# Patient Record
Sex: Female | Born: 1980 | Race: Black or African American | Hispanic: No | Marital: Married | Smoking: Never smoker
Health system: Southern US, Community
[De-identification: ages and names within clinical notes are randomized; demographics above are authoritative.]

## PROBLEM LIST (undated history)

## (undated) DIAGNOSIS — T7840XA Allergy, unspecified, initial encounter: Secondary | ICD-10-CM

## (undated) DIAGNOSIS — R87629 Unspecified abnormal cytological findings in specimens from vagina: Secondary | ICD-10-CM

## (undated) DIAGNOSIS — E271 Primary adrenocortical insufficiency: Secondary | ICD-10-CM

## (undated) DIAGNOSIS — K219 Gastro-esophageal reflux disease without esophagitis: Secondary | ICD-10-CM

## (undated) DIAGNOSIS — Z8619 Personal history of other infectious and parasitic diseases: Secondary | ICD-10-CM

## (undated) DIAGNOSIS — E039 Hypothyroidism, unspecified: Secondary | ICD-10-CM

## (undated) DIAGNOSIS — U071 COVID-19: Secondary | ICD-10-CM

## (undated) DIAGNOSIS — D649 Anemia, unspecified: Secondary | ICD-10-CM

## (undated) DIAGNOSIS — Z9079 Acquired absence of other genital organ(s): Secondary | ICD-10-CM

## (undated) HISTORY — DX: COVID-19: U07.1

## (undated) HISTORY — DX: Unspecified abnormal cytological findings in specimens from vagina: R87.629

## (undated) HISTORY — DX: Gastro-esophageal reflux disease without esophagitis: K21.9

## (undated) HISTORY — DX: Allergy, unspecified, initial encounter: T78.40XA

## (undated) HISTORY — DX: Personal history of other infectious and parasitic diseases: Z86.19

## (undated) HISTORY — PX: OTHER SURGICAL HISTORY: SHX169

---

## 2010-05-04 ENCOUNTER — Inpatient Hospital Stay (HOSPITAL_COMMUNITY): Admission: RE | Admit: 2010-05-04 | Discharge: 2010-05-07 | Payer: Self-pay | Admitting: Obstetrics and Gynecology

## 2010-09-13 LAB — BASIC METABOLIC PANEL
BUN: 4 mg/dL — ABNORMAL LOW (ref 6–23)
CO2: 25 mEq/L (ref 19–32)
Calcium: 8.4 mg/dL (ref 8.4–10.5)
Creatinine, Ser: 0.57 mg/dL (ref 0.4–1.2)
Glucose, Bld: 101 mg/dL — ABNORMAL HIGH (ref 70–99)

## 2010-09-13 LAB — SURGICAL PCR SCREEN: Staphylococcus aureus: NEGATIVE

## 2010-09-13 LAB — CBC
Hemoglobin: 10.3 g/dL — ABNORMAL LOW (ref 12.0–15.0)
Hemoglobin: 12.2 g/dL (ref 12.0–15.0)
MCH: 28.5 pg (ref 26.0–34.0)
MCH: 28.6 pg (ref 26.0–34.0)
MCHC: 33 g/dL (ref 30.0–36.0)
MCHC: 33.1 g/dL (ref 30.0–36.0)
Platelets: 124 10*3/uL — ABNORMAL LOW (ref 150–400)
Platelets: 127 10*3/uL — ABNORMAL LOW (ref 150–400)
RDW: 14.4 % (ref 11.5–15.5)
RDW: 14.6 % (ref 11.5–15.5)

## 2010-09-13 LAB — RPR: RPR Ser Ql: NONREACTIVE

## 2011-04-10 LAB — OB RESULTS CONSOLE HIV ANTIBODY (ROUTINE TESTING): HIV: NONREACTIVE

## 2011-04-10 LAB — OB RESULTS CONSOLE ANTIBODY SCREEN: Antibody Screen: NEGATIVE

## 2011-04-24 LAB — OB RESULTS CONSOLE GC/CHLAMYDIA: Chlamydia: NEGATIVE

## 2011-08-29 LAB — OB RESULTS CONSOLE RPR: RPR: NONREACTIVE

## 2011-08-29 LAB — OB RESULTS CONSOLE ABO/RH

## 2011-10-24 ENCOUNTER — Encounter (HOSPITAL_COMMUNITY): Payer: Self-pay | Admitting: Pharmacist

## 2011-11-01 ENCOUNTER — Other Ambulatory Visit: Payer: Self-pay | Admitting: Obstetrics and Gynecology

## 2011-11-03 ENCOUNTER — Encounter (HOSPITAL_COMMUNITY): Payer: Self-pay

## 2011-11-06 ENCOUNTER — Encounter (HOSPITAL_COMMUNITY): Payer: Self-pay

## 2011-11-06 ENCOUNTER — Encounter (HOSPITAL_COMMUNITY)
Admission: RE | Admit: 2011-11-06 | Discharge: 2011-11-06 | Disposition: A | Discharge status: Home / Self Care | Payer: BC Managed Care – PPO | Source: Ambulatory Visit | Attending: Obstetrics and Gynecology | Admitting: Obstetrics and Gynecology

## 2011-11-06 HISTORY — DX: Primary adrenocortical insufficiency: E27.1

## 2011-11-06 HISTORY — DX: Anemia, unspecified: D64.9

## 2011-11-06 HISTORY — DX: Hypothyroidism, unspecified: E03.9

## 2011-11-06 LAB — BASIC METABOLIC PANEL
BUN: 5 mg/dL — ABNORMAL LOW (ref 6–23)
CO2: 24 mEq/L (ref 19–32)
Calcium: 8.2 mg/dL — ABNORMAL LOW (ref 8.4–10.5)
Chloride: 104 mEq/L (ref 96–112)
Creatinine, Ser: 0.51 mg/dL (ref 0.50–1.10)

## 2011-11-06 LAB — CBC
HCT: 32.1 % — ABNORMAL LOW (ref 36.0–46.0)
MCHC: 29.9 g/dL — ABNORMAL LOW (ref 30.0–36.0)
MCV: 75.9 fL — ABNORMAL LOW (ref 78.0–100.0)
Platelets: 161 10*3/uL (ref 150–400)
RDW: 16.9 % — ABNORMAL HIGH (ref 11.5–15.5)
WBC: 6.3 10*3/uL (ref 4.0–10.5)

## 2011-11-06 LAB — SURGICAL PCR SCREEN: Staphylococcus aureus: NEGATIVE

## 2011-11-06 NOTE — Pre-Procedure Instructions (Signed)
I asked Dr. Rodman Pickle if pt should take her Cortef as usual the am of surgery- Dr. Rodman Pickle does want pt to take at home prior to arrival to hospital.

## 2011-11-06 NOTE — Patient Instructions (Addendum)
YOUR PROCEDURE IS SCHEDULED ON:11/13/11 ENTER THROUGH THE MAIN ENTRANCE OF [facility] AT:0745am  USE DESK PHONE AND DIAL [phone] TO INFORM us OF YOUR ARRIVAL  CALL [phone] IF YOU HAVE ANY QUESTIONS OR PROBLEMS PRIOR TO YOUR ARRIVAL.  REMEMBER: DO NOT EAT OR DRINK AFTER MIDNIGHT Sunday  YOU MAY BRUSH YOUR TEETH THE MORNING OF SURGERY   TAKE THESE MEDICINES THE DAY OF SURGERY WITH SIP OF WATER:thyroid med, Cortef   DO NOT WEAR JEWELRY, EYE MAKEUP, LIPSTICK OR DARK FINGERNAIL POLISH DO NOT WEAR LOTIONS DO NOT SHAVE FOR 48 HOURS PRIOR TO SURGERY  YOU WILL NOT BE ALLOWED TO DRIVE YOURSELF HOME.  NAME OF DRIVER:Marcus Maisie Fus

## 2011-11-07 NOTE — Pre-Procedure Instructions (Signed)
Dr. Rodman Pickle notified of pt's K+ result of 3.0. Pt has Addison's Disease and Dr. Rodman Pickle ok with low result.

## 2011-11-08 ENCOUNTER — Encounter (HOSPITAL_COMMUNITY)
Admission: AD | Disposition: A | Discharge status: Home / Self Care | Payer: Self-pay | Source: Ambulatory Visit | Attending: Obstetrics and Gynecology

## 2011-11-08 ENCOUNTER — Inpatient Hospital Stay (HOSPITAL_COMMUNITY)
Admission: AD | Admit: 2011-11-08 | Discharge: 2011-11-10 | DRG: 370 | Disposition: A | Discharge status: Home / Self Care | Payer: BC Managed Care – PPO | Source: Ambulatory Visit | Attending: Obstetrics and Gynecology | Admitting: Obstetrics and Gynecology

## 2011-11-08 ENCOUNTER — Encounter (HOSPITAL_COMMUNITY): Payer: Self-pay | Admitting: Anesthesiology

## 2011-11-08 ENCOUNTER — Inpatient Hospital Stay (HOSPITAL_COMMUNITY): Payer: BC Managed Care – PPO | Admitting: Anesthesiology

## 2011-11-08 ENCOUNTER — Encounter (HOSPITAL_COMMUNITY): Payer: Self-pay | Admitting: *Deleted

## 2011-11-08 ENCOUNTER — Encounter (HOSPITAL_COMMUNITY): Payer: Self-pay | Admitting: General Surgery

## 2011-11-08 DIAGNOSIS — Z01812 Encounter for preprocedural laboratory examination: Secondary | ICD-10-CM

## 2011-11-08 DIAGNOSIS — O34219 Maternal care for unspecified type scar from previous cesarean delivery: Principal | ICD-10-CM | POA: Diagnosis present

## 2011-11-08 DIAGNOSIS — E039 Hypothyroidism, unspecified: Secondary | ICD-10-CM | POA: Diagnosis present

## 2011-11-08 DIAGNOSIS — D62 Acute posthemorrhagic anemia: Secondary | ICD-10-CM | POA: Diagnosis not present

## 2011-11-08 DIAGNOSIS — O9903 Anemia complicating the puerperium: Secondary | ICD-10-CM | POA: Diagnosis not present

## 2011-11-08 DIAGNOSIS — Z01818 Encounter for other preprocedural examination: Secondary | ICD-10-CM

## 2011-11-08 DIAGNOSIS — E2749 Other adrenocortical insufficiency: Secondary | ICD-10-CM | POA: Diagnosis present

## 2011-11-08 DIAGNOSIS — E079 Disorder of thyroid, unspecified: Secondary | ICD-10-CM | POA: Diagnosis present

## 2011-11-08 LAB — CBC
MCHC: 30.4 g/dL (ref 30.0–36.0)
RDW: 16.9 % — ABNORMAL HIGH (ref 11.5–15.5)
WBC: 8.2 10*3/uL (ref 4.0–10.5)

## 2011-11-08 LAB — RPR: RPR Ser Ql: NONREACTIVE

## 2011-11-08 SURGERY — Surgical Case
Anesthesia: Spinal | Site: Abdomen | Wound class: Clean Contaminated

## 2011-11-08 MED ORDER — NALBUPHINE HCL 10 MG/ML IJ SOLN: 5.0000 mg | INTRAMUSCULAR | Status: DC | PRN

## 2011-11-08 MED ORDER — KETOROLAC TROMETHAMINE 60 MG/2ML IM SOLN
60.0000 mg | Freq: Once | INTRAMUSCULAR | Status: AC | PRN
  Administered 2011-11-08: 60 mg via INTRAMUSCULAR

## 2011-11-08 MED ORDER — HYDROCORTISONE SOD SUCCINATE 100 MG IJ SOLR
100.0000 mg | Freq: Once | INTRAMUSCULAR | Status: AC
  Administered 2011-11-08: 100 mg via INTRAVENOUS
  Filled 2011-11-08: qty 2

## 2011-11-08 MED ORDER — FLEET ENEMA 7-19 GM/118ML RE ENEM: 1.0000 | ENEMA | Freq: Every day | RECTAL | Status: DC | PRN

## 2011-11-08 MED ORDER — IBUPROFEN 600 MG PO TABS
600.0000 mg | ORAL_TABLET | Freq: Four times a day (QID) | ORAL | Status: DC
  Administered 2011-11-09 – 2011-11-10 (×6): 600 mg via ORAL
  Filled 2011-11-08 (×6): qty 1

## 2011-11-08 MED ORDER — HYDROCORTISONE SOD SUCCINATE 100 MG IJ SOLR
100.0000 mg | Freq: Three times a day (TID) | INTRAMUSCULAR | Status: DC
Start: 2011-11-08 — End: 2011-11-09
  Administered 2011-11-08 – 2011-11-09 (×2): 100 mg via INTRAVENOUS
  Filled 2011-11-08 (×6): qty 2

## 2011-11-08 MED ORDER — NALOXONE HCL 0.4 MG/ML IJ SOLN: 0.4000 mg | INTRAMUSCULAR | Status: DC | PRN

## 2011-11-08 MED ORDER — HYDROMORPHONE HCL PF 1 MG/ML IJ SOLN: 0.2500 mg | INTRAMUSCULAR | Status: DC | PRN

## 2011-11-08 MED ORDER — LANOLIN HYDROUS EX OINT: 1.0000 "application " | TOPICAL_OINTMENT | CUTANEOUS | Status: DC | PRN

## 2011-11-08 MED ORDER — CEFAZOLIN SODIUM 1-5 GM-% IV SOLN
1.0000 g | Freq: Three times a day (TID) | INTRAVENOUS | Status: AC
  Administered 2011-11-08 – 2011-11-09 (×2): 1 g via INTRAVENOUS
  Filled 2011-11-08 (×2): qty 50

## 2011-11-08 MED ORDER — METOCLOPRAMIDE HCL 5 MG/ML IJ SOLN: 10.0000 mg | Freq: Three times a day (TID) | INTRAMUSCULAR | Status: DC | PRN

## 2011-11-08 MED ORDER — TETANUS-DIPHTH-ACELL PERTUSSIS 5-2.5-18.5 LF-MCG/0.5 IM SUSP: 0.5000 mL | Freq: Once | INTRAMUSCULAR | Status: DC

## 2011-11-08 MED ORDER — SODIUM CHLORIDE 0.9 % IV SOLN: 1.0000 ug/kg/h | INTRAVENOUS | Status: DC | PRN

## 2011-11-08 MED ORDER — LEVOTHYROXINE SODIUM 75 MCG PO TABS
75.0000 ug | ORAL_TABLET | Freq: Every day | ORAL | Status: DC
  Administered 2011-11-09 – 2011-11-10 (×2): 75 ug via ORAL
  Filled 2011-11-08 (×2): qty 1

## 2011-11-08 MED ORDER — SODIUM CHLORIDE 0.9 % IJ SOLN: 3.0000 mL | INTRAMUSCULAR | Status: DC | PRN

## 2011-11-08 MED ORDER — PRENATAL MULTIVITAMIN CH
1.0000 | ORAL_TABLET | Freq: Every day | ORAL | Status: DC
  Administered 2011-11-09 – 2011-11-10 (×2): 1 via ORAL
  Filled 2011-11-08 (×2): qty 1

## 2011-11-08 MED ORDER — BUPIVACAINE-EPINEPHRINE (PF) 0.5% -1:200000 IJ SOLN
INTRAMUSCULAR | Status: AC
  Filled 2011-11-08: qty 10

## 2011-11-08 MED ORDER — OXYCODONE-ACETAMINOPHEN 5-325 MG PO TABS: 1.0000 | ORAL_TABLET | ORAL | Status: DC | PRN

## 2011-11-08 MED ORDER — DIPHENHYDRAMINE HCL 50 MG/ML IJ SOLN: 25.0000 mg | INTRAMUSCULAR | Status: DC | PRN

## 2011-11-08 MED ORDER — EPHEDRINE SULFATE 50 MG/ML IJ SOLN
INTRAMUSCULAR | Status: DC | PRN
  Administered 2011-11-08 (×4): 5 mg via INTRAVENOUS
  Administered 2011-11-08: 10 mg via INTRAVENOUS
  Administered 2011-11-08: 5 mg via INTRAVENOUS

## 2011-11-08 MED ORDER — OXYTOCIN 20 UNITS IN LACTATED RINGERS INFUSION - SIMPLE
INTRAVENOUS | Status: AC
  Filled 2011-11-08: qty 1000

## 2011-11-08 MED ORDER — KETOROLAC TROMETHAMINE 30 MG/ML IJ SOLN: 30.0000 mg | Freq: Four times a day (QID) | INTRAMUSCULAR | Status: DC | PRN

## 2011-11-08 MED ORDER — HYDROCORTISONE 5 MG PO TABS: 15.0000 mg | ORAL_TABLET | Freq: Every day | ORAL | Status: DC

## 2011-11-08 MED ORDER — MENTHOL 3 MG MT LOZG: 1.0000 | LOZENGE | OROMUCOSAL | Status: DC | PRN

## 2011-11-08 MED ORDER — IBUPROFEN 600 MG PO TABS: 600.0000 mg | ORAL_TABLET | Freq: Four times a day (QID) | ORAL | Status: DC | PRN

## 2011-11-08 MED ORDER — ONDANSETRON HCL 4 MG/2ML IJ SOLN: 4.0000 mg | Freq: Three times a day (TID) | INTRAMUSCULAR | Status: DC | PRN

## 2011-11-08 MED ORDER — DIPHENHYDRAMINE HCL 50 MG/ML IJ SOLN: 12.5000 mg | INTRAMUSCULAR | Status: DC | PRN

## 2011-11-08 MED ORDER — CEFAZOLIN SODIUM-DEXTROSE 2-3 GM-% IV SOLR
2.0000 g | INTRAVENOUS | Status: AC
  Administered 2011-11-08: 2 g via INTRAVENOUS
  Filled 2011-11-08: qty 50

## 2011-11-08 MED ORDER — OXYTOCIN 20 UNITS IN LACTATED RINGERS INFUSION - SIMPLE: 125.0000 mL/h | INTRAVENOUS | Status: AC

## 2011-11-08 MED ORDER — SIMETHICONE 80 MG PO CHEW
80.0000 mg | CHEWABLE_TABLET | Freq: Three times a day (TID) | ORAL | Status: DC
  Administered 2011-11-08 – 2011-11-10 (×6): 80 mg via ORAL

## 2011-11-08 MED ORDER — PRENATAL MULTIVITAMIN CH: 1.0000 | ORAL_TABLET | Freq: Every day | ORAL | Status: DC

## 2011-11-08 MED ORDER — SIMETHICONE 80 MG PO CHEW: 80.0000 mg | CHEWABLE_TABLET | ORAL | Status: DC | PRN

## 2011-11-08 MED ORDER — PHENYLEPHRINE HCL 10 MG/ML IJ SOLN
INTRAMUSCULAR | Status: DC | PRN
  Administered 2011-11-08 (×2): 80 ug via INTRAVENOUS

## 2011-11-08 MED ORDER — MORPHINE SULFATE (PF) 0.5 MG/ML IJ SOLN
INTRAMUSCULAR | Status: DC | PRN
  Administered 2011-11-08: .15 mg via INTRATHECAL

## 2011-11-08 MED ORDER — SENNOSIDES-DOCUSATE SODIUM 8.6-50 MG PO TABS
2.0000 | ORAL_TABLET | Freq: Every day | ORAL | Status: DC
  Administered 2011-11-09: 2 via ORAL

## 2011-11-08 MED ORDER — HYDROCORTISONE 10 MG PO TABS
10.0000 mg | ORAL_TABLET | Freq: Every day | ORAL | Status: DC
  Administered 2011-11-10: 10 mg via ORAL
  Filled 2011-11-08 (×2): qty 1

## 2011-11-08 MED ORDER — BUPIVACAINE HCL (PF) 0.25 % IJ SOLN
INTRAMUSCULAR | Status: DC | PRN
  Administered 2011-11-08: 5 mL

## 2011-11-08 MED ORDER — ONDANSETRON HCL 4 MG/2ML IJ SOLN: 4.0000 mg | INTRAMUSCULAR | Status: DC | PRN

## 2011-11-08 MED ORDER — METHYLERGONOVINE MALEATE 0.2 MG/ML IJ SOLN: 0.2000 mg | INTRAMUSCULAR | Status: DC | PRN

## 2011-11-08 MED ORDER — WITCH HAZEL-GLYCERIN EX PADS: 1.0000 "application " | MEDICATED_PAD | CUTANEOUS | Status: DC | PRN

## 2011-11-08 MED ORDER — KETOROLAC TROMETHAMINE 60 MG/2ML IM SOLN
INTRAMUSCULAR | Status: AC
  Administered 2011-11-08: 60 mg via INTRAMUSCULAR
  Filled 2011-11-08: qty 2

## 2011-11-08 MED ORDER — DIPHENHYDRAMINE HCL 25 MG PO CAPS: 25.0000 mg | ORAL_CAPSULE | Freq: Four times a day (QID) | ORAL | Status: DC | PRN

## 2011-11-08 MED ORDER — SODIUM CHLORIDE 0.9 % IJ SOLN: 3.0000 mL | Freq: Two times a day (BID) | INTRAMUSCULAR | Status: DC

## 2011-11-08 MED ORDER — HYDROCORTISONE 5 MG PO TABS
5.0000 mg | ORAL_TABLET | Freq: Every day | ORAL | Status: DC
  Administered 2011-11-09: 5 mg via ORAL
  Filled 2011-11-08: qty 1

## 2011-11-08 MED ORDER — HYDROCORTISONE SOD SUCCINATE 100 MG IJ SOLR: 100.0000 mg | Freq: Three times a day (TID) | INTRAMUSCULAR | Status: DC

## 2011-11-08 MED ORDER — ZOLPIDEM TARTRATE 5 MG PO TABS: 5.0000 mg | ORAL_TABLET | Freq: Every evening | ORAL | Status: DC | PRN

## 2011-11-08 MED ORDER — SCOPOLAMINE 1 MG/3DAYS TD PT72: 1.0000 | MEDICATED_PATCH | Freq: Once | TRANSDERMAL | Status: DC

## 2011-11-08 MED ORDER — ONDANSETRON HCL 4 MG PO TABS: 4.0000 mg | ORAL_TABLET | ORAL | Status: DC | PRN

## 2011-11-08 MED ORDER — POTASSIUM CHLORIDE CRYS ER 20 MEQ PO TBCR
40.0000 meq | EXTENDED_RELEASE_TABLET | Freq: Two times a day (BID) | ORAL | Status: DC
  Administered 2011-11-09 – 2011-11-10 (×3): 40 meq via ORAL
  Filled 2011-11-08 (×4): qty 2

## 2011-11-08 MED ORDER — METHYLERGONOVINE MALEATE 0.2 MG PO TABS: 0.2000 mg | ORAL_TABLET | ORAL | Status: DC | PRN

## 2011-11-08 MED ORDER — MEPERIDINE HCL 25 MG/ML IJ SOLN: 6.2500 mg | INTRAMUSCULAR | Status: DC | PRN

## 2011-11-08 MED ORDER — FENTANYL CITRATE 0.05 MG/ML IJ SOLN
INTRAMUSCULAR | Status: DC | PRN
  Administered 2011-11-08: 25 ug via INTRATHECAL

## 2011-11-08 MED ORDER — DIBUCAINE 1 % RE OINT: 1.0000 "application " | TOPICAL_OINTMENT | RECTAL | Status: DC | PRN

## 2011-11-08 MED ORDER — OXYTOCIN 10 UNIT/ML IJ SOLN
INTRAMUSCULAR | Status: DC | PRN
  Administered 2011-11-08 (×2): 20 [IU] via INTRAMUSCULAR

## 2011-11-08 MED ORDER — DIPHENHYDRAMINE HCL 25 MG PO CAPS: 25.0000 mg | ORAL_CAPSULE | ORAL | Status: DC | PRN

## 2011-11-08 MED ORDER — LACTATED RINGERS IV SOLN
INTRAVENOUS | Status: DC | PRN
  Administered 2011-11-08 (×2): via INTRAVENOUS

## 2011-11-08 MED ORDER — ONDANSETRON HCL 4 MG/2ML IJ SOLN
INTRAMUSCULAR | Status: DC | PRN
  Administered 2011-11-08: 4 mg via INTRAVENOUS

## 2011-11-08 MED ORDER — BUPIVACAINE HCL (PF) 0.25 % IJ SOLN
INTRAMUSCULAR | Status: AC
  Filled 2011-11-08: qty 30

## 2011-11-08 MED ORDER — BISACODYL 10 MG RE SUPP: 10.0000 mg | Freq: Every day | RECTAL | Status: DC | PRN

## 2011-11-08 MED ORDER — SODIUM CHLORIDE 0.9 % IV SOLN: 250.0000 mL | INTRAVENOUS | Status: DC

## 2011-11-08 SURGICAL SUPPLY — 45 items
BARRIER ADHS 3X4 INTERCEED (GAUZE/BANDAGES/DRESSINGS) ×2 IMPLANT
BENZOIN TINCTURE PRP APPL 2/3 (GAUZE/BANDAGES/DRESSINGS) IMPLANT
CHLORAPREP W/TINT 26ML (MISCELLANEOUS) ×2 IMPLANT
CLOTH BEACON ORANGE TIMEOUT ST (SAFETY) ×2 IMPLANT
CONTAINER PREFILL 10% NBF 15ML (MISCELLANEOUS) IMPLANT
DRESSING TELFA 8X3 (GAUZE/BANDAGES/DRESSINGS) ×2 IMPLANT
ELECT REM PT RETURN 9FT ADLT (ELECTROSURGICAL) ×2
ELECTRODE REM PT RTRN 9FT ADLT (ELECTROSURGICAL) ×1 IMPLANT
EXTRACTOR VACUUM KIWI (MISCELLANEOUS) IMPLANT
EXTRACTOR VACUUM M CUP 4 TUBE (SUCTIONS) IMPLANT
GAUZE SPONGE 4X4 12PLY STRL LF (GAUZE/BANDAGES/DRESSINGS) ×4 IMPLANT
GLOVE BIO SURGEON STRL SZ 6.5 (GLOVE) ×2 IMPLANT
GLOVE BIOGEL PI IND STRL 6.5 (GLOVE) ×1 IMPLANT
GLOVE BIOGEL PI IND STRL 7.0 (GLOVE) ×2 IMPLANT
GLOVE BIOGEL PI INDICATOR 6.5 (GLOVE) ×1
GLOVE BIOGEL PI INDICATOR 7.0 (GLOVE) ×2
GLOVE ECLIPSE 7.0 STRL STRAW (GLOVE) ×2 IMPLANT
GLOVE ECLIPSE 8.0 STRL XLNG CF (GLOVE) ×2 IMPLANT
GLOVE INDICATOR 7.0 STRL GRN (GLOVE) ×2 IMPLANT
GLOVE SURG SS PI 7.0 STRL IVOR (GLOVE) ×2 IMPLANT
GOWN PREVENTION PLUS LG XLONG (DISPOSABLE) ×12 IMPLANT
KIT ABG SYR 3ML LUER SLIP (SYRINGE) IMPLANT
NEEDLE HYPO 25X1 1.5 SAFETY (NEEDLE) ×2 IMPLANT
NEEDLE HYPO 25X5/8 SAFETYGLIDE (NEEDLE) IMPLANT
NS IRRIG 1000ML POUR BTL (IV SOLUTION) ×2 IMPLANT
PACK C SECTION WH (CUSTOM PROCEDURE TRAY) ×2 IMPLANT
PAD ABD 7.5X8 STRL (GAUZE/BANDAGES/DRESSINGS) ×2 IMPLANT
RTRCTR C-SECT PINK 25CM LRG (MISCELLANEOUS) IMPLANT
SLEEVE SCD COMPRESS KNEE MED (MISCELLANEOUS) IMPLANT
STAPLER VISISTAT 35W (STAPLE) IMPLANT
STRIP CLOSURE SKIN 1/2X4 (GAUZE/BANDAGES/DRESSINGS) IMPLANT
SUT CHROMIC GUT AB #0 18 (SUTURE) IMPLANT
SUT MNCRL 0 VIOLET CTX 36 (SUTURE) ×3 IMPLANT
SUT MON AB 4-0 PS1 27 (SUTURE) IMPLANT
SUT MONOCRYL 0 CTX 36 (SUTURE) ×3
SUT PLAIN 2 0 (SUTURE)
SUT PLAIN ABS 2-0 CT1 27XMFL (SUTURE) IMPLANT
SUT VIC AB 0 CT1 27 (SUTURE) ×2
SUT VIC AB 0 CT1 27XBRD ANBCTR (SUTURE) ×2 IMPLANT
SUT VIC AB 2-0 CT1 27 (SUTURE) ×1
SUT VIC AB 2-0 CT1 TAPERPNT 27 (SUTURE) ×1 IMPLANT
SYR CONTROL 10ML LL (SYRINGE) ×2 IMPLANT
TOWEL OR 17X24 6PK STRL BLUE (TOWEL DISPOSABLE) ×4 IMPLANT
TRAY FOLEY CATH 14FR (SET/KITS/TRAYS/PACK) IMPLANT
WATER STERILE IRR 1000ML POUR (IV SOLUTION) ×2 IMPLANT

## 2011-11-08 NOTE — Anesthesia Procedure Notes (Signed)

## 2011-11-08 NOTE — Transfer of Care (Signed)
Immediate Anesthesia Transfer of Care Note  Patient: Heidi Adkins  Procedure(s) Performed: Procedure(s) (LRB): CESAREAN SECTION (N/A)  Patient Location: PACU  Anesthesia Type: Spinal  Level of Consciousness: awake, alert  and oriented  Airway & Oxygen Therapy: Patient Spontanous Breathing  Post-op Assessment: Report given to PACU RN and Post -op Vital signs reviewed and stable  Post vital signs: Reviewed and stable  Complications: No apparent anesthesia complications

## 2011-11-08 NOTE — Anesthesia Preprocedure Evaluation (Addendum)
Anesthesia Evaluation  Patient identified by MRN, date of birth, ID band Patient awake    Reviewed: Allergy & Precautions, H&P , Patient's Chart, lab work & pertinent test results  Airway Mallampati: II TM Distance: >3 FB Neck ROM: full    Dental No notable dental hx.    Pulmonary  breath sounds clear to auscultation  Pulmonary exam normal       Cardiovascular Exercise Tolerance: Good Rhythm:regular Rate:Normal     Neuro/Psych    GI/Hepatic   Endo/Other  Hypothyroidism Addison's Disease, K+ 3.0  Renal/GU      Musculoskeletal   Abdominal   Peds  Hematology   Anesthesia Other Findings   Reproductive/Obstetrics                           Anesthesia Physical Anesthesia Plan  ASA: II  Anesthesia Plan: Spinal   Post-op Pain Management:    Induction:   Airway Management Planned:   Additional Equipment:   Intra-op Plan:   Post-operative Plan:   Informed Consent: I have reviewed the patients History and Physical, chart, labs and discussed the procedure including the risks, benefits and alternatives for the proposed anesthesia with the patient or authorized representative who has indicated his/her understanding and acceptance.   Dental Advisory Given  Plan Discussed with: CRNA  Anesthesia Plan Comments: (Lab work confirmed with CRNA in room. Platelets okay. Discussed spinal anesthetic, and patient consents to the procedure:  included risk of possible headache,backache, failed block, allergic reaction, and nerve injury. This patient was asked if she had any questions or concerns before the procedure started. )        Anesthesia Quick Evaluation

## 2011-11-08 NOTE — MAU Note (Signed)
For repeat c-section; laboring and SROM today;

## 2011-11-08 NOTE — Brief Op Note (Signed)
11/08/2011  2:41 PM  PATIENT:  Heidi Adkins  31 y.o. female  PRE-OPERATIVE DIAGNOSIS:  Spontaneous rupture of membrane, Labor, Previous Cesarean section, Addison disease  POST-OPERATIVE DIAGNOSIS:  Spontaneous rupture of membrane, Labor, Previous Cesarean section, Addison disease  PROCEDURE:  Procedure(s) (LRB): REPEAT LOW TRANSVERSE CESAREAN SECTION (N/A)  SURGEON:  Surgeon(s) and Role:    * Cambrie Sonnenfeld Cathie Beams, MD - Primary  PHYSICIAN ASSISTANT:   ASSISTANTS: Marlinda Mike, CNM   ANESTHESIA:   spinal  FINDINGS: LIVE FEMALE ROT Apgar 9/9, 9lb 1.2 oz Nl tubes. Nl ovaries.  EBL:   800cc I: 2000 O: 250 cc  BLOOD ADMINISTERED:none  DRAINS: none   LOCAL MEDICATIONS USED:  MARCAINE     SPECIMEN:  Source of Specimen:  placenta  DISPOSITION OF SPECIMEN:  N/A  COUNTS:  YES  TOURNIQUET:  * No tourniquets in log *  DICTATION: .Other Dictation: Dictation Number   PLAN OF CARE: Admit to inpatient   PATIENT DISPOSITION:  PACU - hemodynamically stable.   Delay start of Pharmacological VTE agent (>24hrs) due to surgical blood loss or risk of bleeding: no

## 2011-11-09 ENCOUNTER — Encounter (HOSPITAL_COMMUNITY): Payer: Self-pay | Admitting: Obstetrics and Gynecology

## 2011-11-09 LAB — CBC
MCH: 22.7 pg — ABNORMAL LOW (ref 26.0–34.0)
MCHC: 30.3 g/dL (ref 30.0–36.0)
MCV: 75 fL — ABNORMAL LOW (ref 78.0–100.0)
Platelets: 148 10*3/uL — ABNORMAL LOW (ref 150–400)
RDW: 17.1 % — ABNORMAL HIGH (ref 11.5–15.5)
WBC: 14.2 10*3/uL — ABNORMAL HIGH (ref 4.0–10.5)

## 2011-11-09 MED ORDER — POLYSACCHARIDE IRON COMPLEX 150 MG PO CAPS
150.0000 mg | ORAL_CAPSULE | Freq: Every day | ORAL | Status: DC
  Administered 2011-11-09 – 2011-11-10 (×2): 150 mg via ORAL
  Filled 2011-11-09 (×2): qty 1

## 2011-11-09 MED ORDER — LACTATED RINGERS IV SOLN
INTRAVENOUS | Status: DC
  Administered 2011-11-09: 01:00:00 via INTRAVENOUS

## 2011-11-09 MED ORDER — DOCUSATE SODIUM 100 MG PO CAPS
100.0000 mg | ORAL_CAPSULE | Freq: Every day | ORAL | Status: DC
  Filled 2011-11-09: qty 1

## 2011-11-09 NOTE — Progress Notes (Addendum)
Patient ID: Heidi Adkins, female   DOB: 1981/03/30, 32 y.o.   MRN: [id number]  POSTOPERATIVE DAY # 1 S/P cesarean section   S:         Reports feeling well - tired mostly             Tolerating po intake / no nausea / no vomiting / + flatus / no BM             Bleeding is light             Pain controlled with long-acting narcotic med and motrin             Up ad lib / ambulatory  Newborn breast feeding  / Circumcision requested   O:  A & O x 3 NAD             VS: Blood pressure 111/62, pulse 76, temperature 98.2 F (36.8 C), temperature source Oral, resp. rate 18, height 5\' 9"  (1.753 m), weight 92.987 kg (205 lb), SpO2 100.00%, unknown if currently breastfeeding.  LABS: WBC/Hgb/Hct/Plts:  14.2/8.0/26.4/148 (05/09 0528)   I&O:   +  Lungs: Clear and unlabored  Heart: regular rate and rhythm / no mumurs  Abdomen: soft, non-tender, non-distended, active BS             Fundus: firm, non-tender, U-1             Dressing intact without drainage           Perineum: no edema  Lochia: light  Extremities: trace edema, no calf pain or tenderness, compression stockings in place  A:        POD # 1 S/P cesarean section            Chronic anemia with compounded ABL             Addison disease            Hypothyroidism on meds  P:        Routine postoperative care              Iron and colace             Advance activity today - shower and remove dressing by AM             Continue Cortef as ordered - check CMP (glucose in am)             Repeat cbc in am     Marlinda Mike 11/09/2011, 9:49 AM

## 2011-11-09 NOTE — Op Note (Signed)
[name], Heidi Adkins                ACCOUNT NO.:  [id number]  MEDICAL RECORD NO.:  [id number]  LOCATION:  9103                          FACILITY:  [facility]  PHYSICIAN:  Maxie Better, M.D.DATE OF BIRTH:  11/22/1980  DATE OF PROCEDURE:  11/08/2011 DATE OF DISCHARGE:                              OPERATIVE REPORT   PREOPERATIVE DIAGNOSES:  Spontaneous rupture of membranes, labor, previous cesarean section, Addison disease, intrauterine gestation at 56- 2/7th weeks.  POSTOPERATIVE DIAGNOSIS:  Spontaneous rupture of membranes, labor, previous cesarean section, Addison disease, intrauterine gestation at 29- 2/7th weeks.  PROCEDURE:  Repeat cesarean section, Kerr hysterotomy.  ANESTHESIA:  Spinal.  SURGEON:  Maxie Better, MD  ASSISTANT:  Marlinda Mike, CNM  PROCEDURE:  Under adequate spinal anesthesia, the patient was placed in the supine position with a left lateral tilt.  She was sterilely prepped and draped in usual fashion.  An indwelling Foley catheter was sterilely placed.  A 0.25% Marcaine was injected along the previous Pfannenstiel skin incision.  Pfannenstiel skin incision was then made, carried down to the rectus fascia.  The rectus fascia was then opened transversely. Rectus fascia was then bluntly and sharply dissected off the rectus muscle in superior and inferior fashion.  The rectus muscle was split in midline.  The parietal peritoneum was entered sharply and extended.  The vesicouterine peritoneum was draped and adhesed, aberrant in the lower uterine segment and this was sharply dissected and the bladder was displaced inferiorly.  A curvilinear low-transverse uterine incision was then made and extended with bandage scissors.  Subsequent delivery of a live female to direct occiput posterior presentation was accomplished. Baby was bulb suctioned.  The abdomen cord was clamped and cut.  The baby was transferred to the awaiting pediatrician who assigned Apgars of 9  and 9 at 1 and 5 minutes.  The placenta, which was anterior was manually removed.  The uterine cavity was cleaned of debris.  Uterine incision had no extension.  Uterine incision was then closed in 2 layers, the first layer of 0 Monocryl in a running locked stitch, second layer was imbricating using 0 Monocryl suture.  Bleeding on the left angle in resulted a figure-of-eight suture x2 being placed.  Good hemostasis was subsequently noted.  Normal tubes and ovaries were noted bilaterally.  The additional adhesions on the anterior aspect of the uterus was lysed.  The abdomen was then copiously irrigated and suctioned of debris.  Interceed was placed overlying the raw area of the previous scar tissue in the lower uterine segment.  The parietal peritoneum was then closed with 2-0 Vicryl.  The rectus fascia was closed with 0 Vicryl x2.  The subcutaneous areas were irrigated, small bleeders were cauterized.  Interrupted 2-0 plain sutures were placed and the skin approximated using Ethicon staples.  SPECIMEN:  Placenta, not sent to pathology.  ESTIMATED BLOOD LOSS:  800 mL.  INTRAOPERATIVE FLUID:  2 Liters.  URINE OUTPUT:  250 mL, clear yellow urine.  COUNTS:  Sponge and instrument counts x2 was correct.  COMPLICATION:  None.  The patient tolerated the procedure well, was transferred to recovery room in stable condition.  Weight of the baby was 9 pounds  and 1.2 ounces.     Maxie Better, M.D.     [place]/MEDQ  D:  11/08/2011  T:  11/09/2011  Job:  [id number]

## 2011-11-09 NOTE — Anesthesia Postprocedure Evaluation (Signed)
  Anesthesia Post-op Note  Patient: Heidi Adkins  Procedure(s) Performed: Procedure(s) (LRB): CESAREAN SECTION (N/A)  Patient Location: PACU and Mother/Baby  Anesthesia Type: Spinal  Level of Consciousness: awake and alert   Airway and Oxygen Therapy: Patient Spontanous Breathing  Post-op Pain: mild  Post-op Assessment: Patient's Cardiovascular Status Stable, Respiratory Function Stable, No signs of Nausea or vomiting, Adequate PO intake and Pain level controlled  Post-op Vital Signs: stable  Complications: No apparent anesthesia complications

## 2011-11-10 LAB — COMPREHENSIVE METABOLIC PANEL
ALT: 12 U/L (ref 0–35)
AST: 16 U/L (ref 0–37)
Albumin: 2.1 g/dL — ABNORMAL LOW (ref 3.5–5.2)
Alkaline Phosphatase: 110 U/L (ref 39–117)
BUN: 8 mg/dL (ref 6–23)
CO2: 27 mEq/L (ref 19–32)
Calcium: 8.1 mg/dL — ABNORMAL LOW (ref 8.4–10.5)
Chloride: 104 mEq/L (ref 96–112)
Creatinine, Ser: 0.61 mg/dL (ref 0.50–1.10)
GFR calc Af Amer: >90 mL/min (ref >90)
GFR calc non Af Amer: >90 mL/min (ref >90)
Glucose, Bld: 116 mg/dL — ABNORMAL HIGH (ref 70–99)
Potassium: 4.2 mEq/L (ref 3.5–5.1)
Sodium: 136 mEq/L (ref 135–145)
Total Bilirubin: 0.2 mg/dL — ABNORMAL LOW (ref 0.3–1.2)
Total Protein: 5.2 g/dL — ABNORMAL LOW (ref 6.0–8.3)

## 2011-11-10 LAB — CBC
HCT: 26.1 % — ABNORMAL LOW (ref 36.0–46.0)
Hemoglobin: 7.9 g/dL — ABNORMAL LOW (ref 12.0–15.0)
MCH: 23 pg — ABNORMAL LOW (ref 26.0–34.0)
MCHC: 30.3 g/dL (ref 30.0–36.0)
MCV: 75.9 fL — ABNORMAL LOW (ref 78.0–100.0)
Platelets: 153 10*3/uL (ref 150–400)
RBC: 3.44 MIL/uL — ABNORMAL LOW (ref 3.87–5.11)
RDW: 17.4 % — ABNORMAL HIGH (ref 11.5–15.5)
WBC: 16.6 10*3/uL — ABNORMAL HIGH (ref 4.0–10.5)

## 2011-11-10 MED ORDER — IBUPROFEN 600 MG PO TABS: 600.0000 mg | ORAL_TABLET | Freq: Four times a day (QID) | ORAL | Status: AC

## 2011-11-10 MED ORDER — DOCUSATE SODIUM 100 MG PO CAPS: 100.0000 mg | ORAL_CAPSULE | Freq: Two times a day (BID) | ORAL | Status: AC | PRN

## 2011-11-10 MED ORDER — POLYSACCHARIDE IRON COMPLEX 150 MG PO CAPS: 150.0000 mg | ORAL_CAPSULE | Freq: Two times a day (BID) | ORAL | Status: DC

## 2011-11-10 MED ORDER — OXYCODONE-ACETAMINOPHEN 5-325 MG PO TABS: 1.0000 | ORAL_TABLET | Freq: Four times a day (QID) | ORAL | Status: AC | PRN

## 2011-11-10 NOTE — Progress Notes (Signed)
Patient ID: Heidi Adkins, female   DOB: 09-03-1980, 31 y.o.   MRN: [id number] POD # 2  Subjective: Pt reports feeling well and desires early d/c home/ Pain controlled with ibuprofen and percocet Tolerating po/Voiding without problems/ No n/v/Flatus pos Activity: out of bed and ambulate Bleeding is spotting Newborn info:  Information for the patient's newborn:  Fayne, Mcguffee [[id number]]  female / Feeding: breast   Objective: VS: Blood pressure 107/73, pulse 67, temperature 97.7 F (36.5 C), temperature source Oral, resp. rate 18, height 5\' 9"  (1.753 m), weight 92.987 kg (205 lb), SpO2 99.00%, unknown if currently breastfeeding.   LABS:  Basename 11/10/11 0530 11/09/11 0528  WBC 16.6* 14.2*  HGB 7.9* 8.0*  HCT 26.1* 26.4*  PLT 153 148*     Physical Exam:  General: alert, cooperative and no distress CV: Regular rate and rhythm Resp: clear Abdomen: soft, nontender, normal bowel sounds Incision: healing well, well approximated Uterine Fundus: firm, below umbilicus, nontender Lochia: minimal Ext: edema trace    A/P: POD # 2/ G2P1002/ S/P C/Section d/t planned repeat Hx chronic anemia w/ABL anemia Doing well and stable for discharge home Staple removal on 11/15/11 @ 9:45am RX's: Ibuprofen 600mg  po Q 6 hrs prn pain #30 Refill x 1 Percocet 5/325 1 - 2 tabs po every 6 hrs prn pain  #30 No refill Niferex 150mg  po QD/BID #30/#60 Refill x 1 Colace 100mg  po BID prn constipation    Signed: Demetrius Revel, MSN, [facility] 11/10/2011, 10:20 AM

## 2011-11-10 NOTE — Discharge Summary (Signed)
Obstetric Discharge Summary Reason for Admission: rupture of membranes and Planned repeat c/s @ 38wks  Prenatal Procedures: ultrasound Intrapartum Procedures: cesarean: low cervical, transverse Postpartum Procedures: none Complications-Operative and Postpartum: none Hemoglobin  Date Value Range Status  11/10/2011 7.9* 12.0-15.0 (g/dL) Final     HCT  Date Value Range Status  11/10/2011 26.1* 36.0-46.0 (%) Final    Physical Exam:  General: alert, cooperative and no distress Lochia: appropriate Uterine Fundus: firm Incision: healing well, staples well approximated and C/D/I DVT Evaluation: No evidence of DVT seen on physical exam.  Discharge Diagnoses: Term Pregnancy-delivered  Discharge Information: Date: 11/10/2011 Activity: pelvic rest Diet: routine Medications: Ibuprofen, Colace, Iron and Percocet Condition: stable Instructions: refer to practice specific booklet Discharge to: home Follow-up Information    Follow up with COUSINS,SHERONETTE A, MD in 6 weeks.   Contact information:   [address] [place] [address] [phone]          Newborn Data: Live born female on 11/08/11 Birth Weight: 9 lb 1.2 oz (4115 g) APGAR: 9, 9  Home with mother.  Shondra Capps K 11/10/2011, 10:27 AM

## 2011-11-13 ENCOUNTER — Encounter (HOSPITAL_COMMUNITY): Admission: RE | Payer: Self-pay | Source: Ambulatory Visit

## 2011-11-13 ENCOUNTER — Inpatient Hospital Stay (HOSPITAL_COMMUNITY)
Admission: RE | Admit: 2011-11-13 | Payer: BC Managed Care – PPO | Source: Ambulatory Visit | Admitting: Obstetrics and Gynecology

## 2011-11-13 SURGERY — Surgical Case
Anesthesia: Spinal

## 2013-09-17 ENCOUNTER — Ambulatory Visit
Admission: RE | Admit: 2013-09-17 | Discharge: 2013-09-17 | Disposition: A | Discharge status: Home / Self Care | Payer: BC Managed Care – PPO | Source: Ambulatory Visit | Attending: Internal Medicine | Admitting: Internal Medicine

## 2013-09-17 ENCOUNTER — Other Ambulatory Visit: Payer: Self-pay | Admitting: Internal Medicine

## 2013-09-17 DIAGNOSIS — M25561 Pain in right knee: Secondary | ICD-10-CM

## 2014-05-04 ENCOUNTER — Encounter (HOSPITAL_COMMUNITY): Payer: Self-pay | Admitting: Obstetrics and Gynecology

## 2015-04-12 LAB — OB RESULTS CONSOLE RPR: RPR: NONREACTIVE

## 2015-04-12 LAB — OB RESULTS CONSOLE HEPATITIS B SURFACE ANTIGEN: Hepatitis B Surface Ag: NEGATIVE

## 2015-04-12 LAB — OB RESULTS CONSOLE HIV ANTIBODY (ROUTINE TESTING): HIV: NONREACTIVE

## 2015-04-12 LAB — OB RESULTS CONSOLE GC/CHLAMYDIA
Chlamydia: NEGATIVE
GC PROBE AMP, GENITAL: NEGATIVE

## 2015-04-12 LAB — OB RESULTS CONSOLE RUBELLA ANTIBODY, IGM: RUBELLA: IMMUNE

## 2015-04-12 LAB — HIV ANTIBODY (ROUTINE TESTING W REFLEX): HIV: NEGATIVE

## 2015-04-12 LAB — OB RESULTS CONSOLE ABO/RH: RH Type: POSITIVE

## 2015-04-12 LAB — OB RESULTS CONSOLE ANTIBODY SCREEN: ANTIBODY SCREEN: NEGATIVE

## 2015-04-26 LAB — HM PAP SMEAR: HM Pap smear: NORMAL

## 2015-10-08 ENCOUNTER — Other Ambulatory Visit: Payer: Self-pay | Admitting: Obstetrics and Gynecology

## 2015-10-11 LAB — OB RESULTS CONSOLE GBS: STREP GROUP B AG: NEGATIVE

## 2015-10-14 ENCOUNTER — Other Ambulatory Visit: Payer: Self-pay | Admitting: Obstetrics and Gynecology

## 2015-10-28 ENCOUNTER — Encounter (HOSPITAL_COMMUNITY): Payer: Self-pay | Admitting: *Deleted

## 2015-10-28 ENCOUNTER — Telehealth (HOSPITAL_COMMUNITY): Payer: Self-pay | Admitting: *Deleted

## 2015-10-28 NOTE — Telephone Encounter (Signed)
Preadmission screen  

## 2015-10-29 ENCOUNTER — Telehealth (HOSPITAL_COMMUNITY): Payer: Self-pay | Admitting: *Deleted

## 2015-10-29 NOTE — Telephone Encounter (Signed)
Preadmission screen  

## 2015-11-02 ENCOUNTER — Telehealth (HOSPITAL_COMMUNITY): Payer: Self-pay | Admitting: *Deleted

## 2015-11-02 NOTE — Telephone Encounter (Signed)
Preadmission screen  

## 2015-11-03 ENCOUNTER — Encounter (HOSPITAL_COMMUNITY): Payer: Self-pay

## 2015-11-04 NOTE — Patient Instructions (Signed)
20 Heidi PlumeWendy C Adkins  11/04/2015   Your procedure is scheduled on:  11/06/2015  Enter through the Main Entrance of [facility] at 0625 AM.  Come in through [facility].  Call [phone] that morning if you have any questions.  Call this number if you have problems the morning of surgery: [phone]   Remember:   Do not eat food:After Midnight.  Do not drink clear liquids: After Midnight.  Take these medicines the morning of surgery with A SIP OF WATER: synthroid   Do not wear jewelry, make-up or nail polish.  Do not wear lotions, powders, or perfumes. You may wear deodorant.  Do not shave 48 hours prior to surgery.  Do not bring valuables to the hospital.  [facility] is not   responsible for any belongings or valuables brought to the hospital.  Contacts, dentures or bridgework may not be worn into surgery.  Leave suitcase in the car. After surgery it may be brought to your room.  For patients admitted to the hospital, checkout time is 11:00 AM the day of              discharge.   Patients discharged the day of surgery will not be allowed to drive             home.  Name and phone number of your driver:   Special Instructions:   Shower using CHG 2 nights before surgery and the night before surgery.  If you shower the day of surgery use CHG.  Use special wash - you have one bottle of CHG for all showers.  You should use approximately 1/3 of the bottle for each shower.   Please read over the following fact sheets that you were given:   Surgical Site Infection Prevention

## 2015-11-05 ENCOUNTER — Encounter (HOSPITAL_COMMUNITY)
Admission: RE | Admit: 2015-11-05 | Discharge: 2015-11-05 | Disposition: A | Discharge status: Home / Self Care | Payer: BC Managed Care – PPO | Source: Ambulatory Visit | Attending: Obstetrics and Gynecology | Admitting: Obstetrics and Gynecology

## 2015-11-05 LAB — CBC
HCT: 34.5 % — ABNORMAL LOW (ref 36.0–46.0)
Hemoglobin: 11.3 g/dL — ABNORMAL LOW (ref 12.0–15.0)
MCH: 26.8 pg (ref 26.0–34.0)
MCHC: 32.8 g/dL (ref 30.0–36.0)
MCV: 81.8 fL (ref 78.0–100.0)
PLATELETS: 134 10*3/uL — AB (ref 150–400)
RBC: 4.22 MIL/uL (ref 3.87–5.11)
RDW: 17.6 % — ABNORMAL HIGH (ref 11.5–15.5)
WBC: 5.7 10*3/uL (ref 4.0–10.5)

## 2015-11-05 LAB — TYPE AND SCREEN
ABO/RH(D): O POS
Antibody Screen: NEGATIVE

## 2015-11-06 ENCOUNTER — Encounter (HOSPITAL_COMMUNITY)
Admission: AD | Disposition: A | Discharge status: Home / Self Care | Payer: Self-pay | Source: Ambulatory Visit | Attending: Obstetrics and Gynecology

## 2015-11-06 ENCOUNTER — Inpatient Hospital Stay (HOSPITAL_COMMUNITY): Payer: BC Managed Care – PPO | Admitting: Anesthesiology

## 2015-11-06 ENCOUNTER — Inpatient Hospital Stay (HOSPITAL_COMMUNITY)
Admission: AD | Admit: 2015-11-06 | Discharge: 2015-11-08 | DRG: 765 | Disposition: A | Discharge status: Home / Self Care | Payer: BC Managed Care – PPO | Source: Ambulatory Visit | Attending: Obstetrics and Gynecology | Admitting: Obstetrics and Gynecology

## 2015-11-06 ENCOUNTER — Encounter (HOSPITAL_COMMUNITY): Payer: Self-pay | Admitting: *Deleted

## 2015-11-06 DIAGNOSIS — N3289 Other specified disorders of bladder: Secondary | ICD-10-CM | POA: Diagnosis present

## 2015-11-06 DIAGNOSIS — Z302 Encounter for sterilization: Secondary | ICD-10-CM | POA: Diagnosis not present

## 2015-11-06 DIAGNOSIS — Z9079 Acquired absence of other genital organ(s): Secondary | ICD-10-CM

## 2015-11-06 DIAGNOSIS — E271 Primary adrenocortical insufficiency: Secondary | ICD-10-CM | POA: Diagnosis present

## 2015-11-06 DIAGNOSIS — O9902 Anemia complicating childbirth: Secondary | ICD-10-CM | POA: Diagnosis present

## 2015-11-06 DIAGNOSIS — O99284 Endocrine, nutritional and metabolic diseases complicating childbirth: Secondary | ICD-10-CM | POA: Diagnosis present

## 2015-11-06 DIAGNOSIS — Z3A39 39 weeks gestation of pregnancy: Secondary | ICD-10-CM

## 2015-11-06 DIAGNOSIS — O34211 Maternal care for low transverse scar from previous cesarean delivery: Secondary | ICD-10-CM | POA: Diagnosis present

## 2015-11-06 DIAGNOSIS — D62 Acute posthemorrhagic anemia: Secondary | ICD-10-CM | POA: Diagnosis present

## 2015-11-06 DIAGNOSIS — E039 Hypothyroidism, unspecified: Secondary | ICD-10-CM | POA: Diagnosis present

## 2015-11-06 HISTORY — DX: Acquired absence of other genital organ(s): Z90.79

## 2015-11-06 HISTORY — PX: TUBAL LIGATION: SHX77

## 2015-11-06 LAB — RPR: RPR Ser Ql: NONREACTIVE

## 2015-11-06 SURGERY — Surgical Case
Anesthesia: Spinal

## 2015-11-06 MED ORDER — FLEET ENEMA 7-19 GM/118ML RE ENEM: 1.0000 | ENEMA | Freq: Every day | RECTAL | Status: DC | PRN

## 2015-11-06 MED ORDER — SODIUM CHLORIDE 0.9 % IV SOLN
250.0000 mL | INTRAVENOUS | Status: DC
  Administered 2015-11-07: 250 mL via INTRAVENOUS

## 2015-11-06 MED ORDER — LEVOTHYROXINE SODIUM 75 MCG PO TABS
75.0000 ug | ORAL_TABLET | Freq: Every day | ORAL | Status: DC
  Administered 2015-11-07 – 2015-11-08 (×2): 75 ug via ORAL
  Filled 2015-11-06 (×4): qty 1

## 2015-11-06 MED ORDER — PHENYLEPHRINE 8 MG IN D5W 100 ML (0.08MG/ML) PREMIX OPTIME
INJECTION | INTRAVENOUS | Status: DC | PRN
  Administered 2015-11-06: 60 ug/min via INTRAVENOUS

## 2015-11-06 MED ORDER — WITCH HAZEL-GLYCERIN EX PADS: 1.0000 "application " | MEDICATED_PAD | CUTANEOUS | Status: DC | PRN

## 2015-11-06 MED ORDER — NALBUPHINE HCL 10 MG/ML IJ SOLN: 5.0000 mg | INTRAMUSCULAR | Status: DC | PRN

## 2015-11-06 MED ORDER — BISACODYL 10 MG RE SUPP: 10.0000 mg | Freq: Every day | RECTAL | Status: DC | PRN

## 2015-11-06 MED ORDER — CITRIC ACID-SODIUM CITRATE 334-500 MG/5ML PO SOLN
ORAL | Status: AC
  Administered 2015-11-06: 30 mL
  Filled 2015-11-06: qty 15

## 2015-11-06 MED ORDER — ONDANSETRON HCL 4 MG/2ML IJ SOLN
INTRAMUSCULAR | Status: AC
  Filled 2015-11-06: qty 2

## 2015-11-06 MED ORDER — SODIUM CHLORIDE 0.9 % IR SOLN
Status: DC | PRN
  Administered 2015-11-06: 1

## 2015-11-06 MED ORDER — LACTATED RINGERS IV SOLN
INTRAVENOUS | Status: DC | PRN
  Administered 2015-11-06 (×2): via INTRAVENOUS

## 2015-11-06 MED ORDER — PRENATAL MULTIVITAMIN CH
1.0000 | ORAL_TABLET | Freq: Every day | ORAL | Status: DC
  Administered 2015-11-07 – 2015-11-08 (×2): 1 via ORAL
  Filled 2015-11-06 (×2): qty 1

## 2015-11-06 MED ORDER — HYDROCORTISONE 10 MG PO TABS
10.0000 mg | ORAL_TABLET | Freq: Every day | ORAL | Status: DC
  Administered 2015-11-07 – 2015-11-08 (×2): 10 mg via ORAL
  Filled 2015-11-06 (×3): qty 1

## 2015-11-06 MED ORDER — OXYTOCIN 10 UNIT/ML IJ SOLN
40.0000 [IU] | INTRAVENOUS | Status: DC | PRN
  Administered 2015-11-06: 40 [IU] via INTRAVENOUS

## 2015-11-06 MED ORDER — BUPIVACAINE HCL (PF) 0.25 % IJ SOLN
INTRAMUSCULAR | Status: AC
  Filled 2015-11-06: qty 30

## 2015-11-06 MED ORDER — METHYLPREDNISOLONE SODIUM SUCC 125 MG IJ SOLR
100.0000 mg | Freq: Three times a day (TID) | INTRAMUSCULAR | Status: AC
  Administered 2015-11-06 (×2): 100 mg via INTRAVENOUS
  Filled 2015-11-06 (×2): qty 1.6

## 2015-11-06 MED ORDER — SIMETHICONE 80 MG PO CHEW: 80.0000 mg | CHEWABLE_TABLET | ORAL | Status: DC | PRN

## 2015-11-06 MED ORDER — DIPHENHYDRAMINE HCL 25 MG PO CAPS: 25.0000 mg | ORAL_CAPSULE | Freq: Four times a day (QID) | ORAL | Status: DC | PRN

## 2015-11-06 MED ORDER — LACTATED RINGERS IV SOLN
INTRAVENOUS | Status: DC | PRN
  Administered 2015-11-06: 10:00:00 via INTRAVENOUS

## 2015-11-06 MED ORDER — MORPHINE SULFATE (PF) 0.5 MG/ML IJ SOLN
INTRAMUSCULAR | Status: AC
  Filled 2015-11-06: qty 10

## 2015-11-06 MED ORDER — SIMETHICONE 80 MG PO CHEW
80.0000 mg | CHEWABLE_TABLET | Freq: Three times a day (TID) | ORAL | Status: DC
  Administered 2015-11-06 – 2015-11-08 (×6): 80 mg via ORAL
  Filled 2015-11-06 (×6): qty 1

## 2015-11-06 MED ORDER — NALBUPHINE HCL 10 MG/ML IJ SOLN: 5.0000 mg | Freq: Once | INTRAMUSCULAR | Status: DC | PRN

## 2015-11-06 MED ORDER — ACETAMINOPHEN 500 MG PO TABS
1000.0000 mg | ORAL_TABLET | Freq: Four times a day (QID) | ORAL | Status: AC
  Administered 2015-11-06 – 2015-11-07 (×3): 1000 mg via ORAL
  Filled 2015-11-06 (×3): qty 2

## 2015-11-06 MED ORDER — ZOLPIDEM TARTRATE 5 MG PO TABS: 5.0000 mg | ORAL_TABLET | Freq: Every evening | ORAL | Status: DC | PRN

## 2015-11-06 MED ORDER — METHYLPREDNISOLONE SODIUM SUCC 125 MG IJ SOLR
100.0000 mg | Freq: Once | INTRAMUSCULAR | Status: AC
  Administered 2015-11-06: 100 mg via INTRAVENOUS
  Filled 2015-11-06: qty 1.6

## 2015-11-06 MED ORDER — DIBUCAINE 1 % RE OINT: 1.0000 "application " | TOPICAL_OINTMENT | RECTAL | Status: DC | PRN

## 2015-11-06 MED ORDER — OXYCODONE HCL 5 MG PO TABS: 10.0000 mg | ORAL_TABLET | ORAL | Status: DC | PRN

## 2015-11-06 MED ORDER — ONDANSETRON HCL 4 MG/2ML IJ SOLN
INTRAMUSCULAR | Status: DC | PRN
  Administered 2015-11-06: 4 mg via INTRAVENOUS

## 2015-11-06 MED ORDER — ONDANSETRON HCL 4 MG/2ML IJ SOLN: 4.0000 mg | Freq: Three times a day (TID) | INTRAMUSCULAR | Status: DC | PRN

## 2015-11-06 MED ORDER — OXYTOCIN 10 UNIT/ML IJ SOLN
INTRAMUSCULAR | Status: AC
  Filled 2015-11-06: qty 4

## 2015-11-06 MED ORDER — OXYCODONE HCL 5 MG PO TABS: 5.0000 mg | ORAL_TABLET | ORAL | Status: DC | PRN

## 2015-11-06 MED ORDER — SODIUM CHLORIDE 0.9% FLUSH: 3.0000 mL | INTRAVENOUS | Status: DC | PRN

## 2015-11-06 MED ORDER — SIMETHICONE 80 MG PO CHEW
80.0000 mg | CHEWABLE_TABLET | ORAL | Status: DC
  Administered 2015-11-06 – 2015-11-07 (×2): 80 mg via ORAL
  Filled 2015-11-06 (×2): qty 1

## 2015-11-06 MED ORDER — COCONUT OIL OIL
1.0000 "application " | TOPICAL_OIL | Status: DC | PRN
  Administered 2015-11-07: 1 via TOPICAL
  Filled 2015-11-06: qty 120

## 2015-11-06 MED ORDER — MENTHOL 3 MG MT LOZG: 1.0000 | LOZENGE | OROMUCOSAL | Status: DC | PRN

## 2015-11-06 MED ORDER — SODIUM CHLORIDE 0.9% FLUSH
3.0000 mL | Freq: Two times a day (BID) | INTRAVENOUS | Status: DC
  Administered 2015-11-07: 3 mL via INTRAVENOUS

## 2015-11-06 MED ORDER — MORPHINE SULFATE (PF) 0.5 MG/ML IJ SOLN
INTRAMUSCULAR | Status: DC | PRN
  Administered 2015-11-06: .2 mg via INTRATHECAL

## 2015-11-06 MED ORDER — NALOXONE HCL 2 MG/2ML IJ SOSY
1.0000 ug/kg/h | PREFILLED_SYRINGE | INTRAVENOUS | Status: DC | PRN
  Filled 2015-11-06: qty 2

## 2015-11-06 MED ORDER — PHENYLEPHRINE 8 MG IN D5W 100 ML (0.08MG/ML) PREMIX OPTIME
INJECTION | INTRAVENOUS | Status: AC
  Filled 2015-11-06: qty 100

## 2015-11-06 MED ORDER — OXYTOCIN 10 UNIT/ML IJ SOLN: 2.5000 [IU]/h | INTRAMUSCULAR | Status: AC

## 2015-11-06 MED ORDER — CEFAZOLIN SODIUM-DEXTROSE 2-4 GM/100ML-% IV SOLN
2.0000 g | INTRAVENOUS | Status: AC
  Administered 2015-11-06: 2 g via INTRAVENOUS
  Filled 2015-11-06: qty 100

## 2015-11-06 MED ORDER — HYDROCORTISONE NA SUCCINATE PF 100 MG IJ SOLR
100.0000 mg | Freq: Three times a day (TID) | INTRAVENOUS | Status: DC
  Filled 2015-11-06: qty 2

## 2015-11-06 MED ORDER — MEPERIDINE HCL 25 MG/ML IJ SOLN
6.2500 mg | INTRAMUSCULAR | Status: DC | PRN
  Administered 2015-11-06: 6.25 mg via INTRAVENOUS

## 2015-11-06 MED ORDER — NALOXONE HCL 0.4 MG/ML IJ SOLN: 0.4000 mg | INTRAMUSCULAR | Status: DC | PRN

## 2015-11-06 MED ORDER — MEPERIDINE HCL 25 MG/ML IJ SOLN
INTRAMUSCULAR | Status: AC
  Filled 2015-11-06: qty 1

## 2015-11-06 MED ORDER — KETOROLAC TROMETHAMINE 30 MG/ML IJ SOLN
INTRAMUSCULAR | Status: AC
  Filled 2015-11-06: qty 1

## 2015-11-06 MED ORDER — BUPIVACAINE HCL (PF) 0.25 % IJ SOLN
INTRAMUSCULAR | Status: DC | PRN
  Administered 2015-11-06: 8 mL

## 2015-11-06 MED ORDER — DIPHENHYDRAMINE HCL 50 MG/ML IJ SOLN: 12.5000 mg | INTRAMUSCULAR | Status: DC | PRN

## 2015-11-06 MED ORDER — FENTANYL CITRATE (PF) 100 MCG/2ML IJ SOLN
INTRAMUSCULAR | Status: DC | PRN
  Administered 2015-11-06: 10 ug via INTRATHECAL

## 2015-11-06 MED ORDER — DIPHENHYDRAMINE HCL 25 MG PO CAPS
25.0000 mg | ORAL_CAPSULE | ORAL | Status: DC | PRN
  Filled 2015-11-06: qty 1

## 2015-11-06 MED ORDER — FENTANYL CITRATE (PF) 100 MCG/2ML IJ SOLN
INTRAMUSCULAR | Status: AC
  Filled 2015-11-06: qty 2

## 2015-11-06 MED ORDER — SENNOSIDES-DOCUSATE SODIUM 8.6-50 MG PO TABS
2.0000 | ORAL_TABLET | ORAL | Status: DC
  Administered 2015-11-06: 2 via ORAL
  Filled 2015-11-06 (×2): qty 2

## 2015-11-06 MED ORDER — METHYLERGONOVINE MALEATE 0.2 MG/ML IJ SOLN: 0.2000 mg | INTRAMUSCULAR | Status: DC | PRN

## 2015-11-06 MED ORDER — IBUPROFEN 600 MG PO TABS
600.0000 mg | ORAL_TABLET | Freq: Four times a day (QID) | ORAL | Status: DC
  Administered 2015-11-06 – 2015-11-08 (×8): 600 mg via ORAL
  Filled 2015-11-06 (×8): qty 1

## 2015-11-06 MED ORDER — LACTATED RINGERS IV SOLN
INTRAVENOUS | Status: DC
  Administered 2015-11-06: 18:00:00 via INTRAVENOUS

## 2015-11-06 MED ORDER — BUPIVACAINE IN DEXTROSE 0.75-8.25 % IT SOLN
INTRATHECAL | Status: DC | PRN
  Administered 2015-11-06: 1.6 mL via INTRATHECAL

## 2015-11-06 MED ORDER — METHYLERGONOVINE MALEATE 0.2 MG PO TABS: 0.2000 mg | ORAL_TABLET | ORAL | Status: DC | PRN

## 2015-11-06 MED ORDER — FERROUS SULFATE 325 (65 FE) MG PO TABS
325.0000 mg | ORAL_TABLET | Freq: Two times a day (BID) | ORAL | Status: DC
  Administered 2015-11-07 – 2015-11-08 (×3): 325 mg via ORAL
  Filled 2015-11-06 (×3): qty 1

## 2015-11-06 SURGICAL SUPPLY — 50 items
BARRIER ADHS 3X4 INTERCEED (GAUZE/BANDAGES/DRESSINGS) ×8 IMPLANT
BENZOIN TINCTURE PRP APPL 2/3 (GAUZE/BANDAGES/DRESSINGS) ×4 IMPLANT
CLAMP CORD UMBIL (MISCELLANEOUS) IMPLANT
CLOSURE STERI STRIP 1/2 X4 (GAUZE/BANDAGES/DRESSINGS) ×4 IMPLANT
CLOSURE WOUND 1/2 X4 (GAUZE/BANDAGES/DRESSINGS)
CLOTH BEACON ORANGE TIMEOUT ST (SAFETY) ×4 IMPLANT
CONTAINER PREFILL 10% NBF 15ML (MISCELLANEOUS) IMPLANT
DRAPE C SECTION CLR SCREEN (DRAPES) ×4 IMPLANT
DRSG OPSITE POSTOP 4X10 (GAUZE/BANDAGES/DRESSINGS) ×4 IMPLANT
DRSG TELFA 3X8 NADH (GAUZE/BANDAGES/DRESSINGS) ×4 IMPLANT
DURAPREP 26ML APPLICATOR (WOUND CARE) ×4 IMPLANT
ELECT LIGASURE SHORT 9 REUSE (ELECTRODE) ×4 IMPLANT
ELECT REM PT RETURN 9FT ADLT (ELECTROSURGICAL) ×4
ELECTRODE REM PT RTRN 9FT ADLT (ELECTROSURGICAL) ×2 IMPLANT
EXTRACTOR VACUUM M CUP 4 TUBE (SUCTIONS) IMPLANT
EXTRACTOR VACUUM M CUP 4' TUBE (SUCTIONS)
GLOVE BIOGEL PI IND STRL 7.0 (GLOVE) ×4 IMPLANT
GLOVE BIOGEL PI INDICATOR 7.0 (GLOVE) ×4
GLOVE ECLIPSE 6.5 STRL STRAW (GLOVE) ×4 IMPLANT
GOWN STRL REUS W/TWL LRG LVL3 (GOWN DISPOSABLE) ×8 IMPLANT
KIT ABG SYR 3ML LUER SLIP (SYRINGE) IMPLANT
NEEDLE HYPO 22GX1.5 SAFETY (NEEDLE) ×4 IMPLANT
NEEDLE HYPO 25X5/8 SAFETYGLIDE (NEEDLE) IMPLANT
NS IRRIG 1000ML POUR BTL (IV SOLUTION) ×4 IMPLANT
PACK C SECTION WH (CUSTOM PROCEDURE TRAY) ×4 IMPLANT
PAD ABD 7.5X8 STRL (GAUZE/BANDAGES/DRESSINGS) ×4 IMPLANT
PAD OB MATERNITY 4.3X12.25 (PERSONAL CARE ITEMS) ×4 IMPLANT
RTRCTR C-SECT PINK 25CM LRG (MISCELLANEOUS) IMPLANT
SPONGE GAUZE 4X4 12PLY STER LF (GAUZE/BANDAGES/DRESSINGS) ×4 IMPLANT
STAPLER VISISTAT 35W (STAPLE) ×4 IMPLANT
STRIP CLOSURE SKIN 1/2X4 (GAUZE/BANDAGES/DRESSINGS) IMPLANT
SUT CHROMIC GUT AB #0 18 (SUTURE) IMPLANT
SUT MNCRL 0 VIOLET CTX 36 (SUTURE) ×6 IMPLANT
SUT MON AB 2-0 SH 27 (SUTURE)
SUT MON AB 2-0 SH27 (SUTURE) IMPLANT
SUT MON AB 3-0 SH 27 (SUTURE)
SUT MON AB 3-0 SH27 (SUTURE) IMPLANT
SUT MON AB 4-0 PS1 27 (SUTURE) IMPLANT
SUT MONOCRYL 0 CTX 36 (SUTURE) ×6
SUT PLAIN 2 0 (SUTURE)
SUT PLAIN 2 0 XLH (SUTURE) IMPLANT
SUT PLAIN ABS 2-0 CT1 27XMFL (SUTURE) IMPLANT
SUT VIC AB 0 CT1 36 (SUTURE) ×12 IMPLANT
SUT VIC AB 2-0 CT1 (SUTURE) ×4 IMPLANT
SUT VIC AB 2-0 CT1 27 (SUTURE) ×4
SUT VIC AB 2-0 CT1 TAPERPNT 27 (SUTURE) ×4 IMPLANT
SUT VIC AB 4-0 PS2 27 (SUTURE) IMPLANT
SYR CONTROL 10ML LL (SYRINGE) ×4 IMPLANT
TOWEL OR 17X24 6PK STRL BLUE (TOWEL DISPOSABLE) ×4 IMPLANT
TRAY FOLEY CATH SILVER 14FR (SET/KITS/TRAYS/PACK) IMPLANT

## 2015-11-06 NOTE — Anesthesia Postprocedure Evaluation (Signed)
Anesthesia Post Note  Patient: Heidi PlumeWendy C Adkins  Procedure(s) Performed: Procedure(s) (LRB): Repeat CESAREAN SECTION (N/A) BILATERAL TUBAL LIGATION (Bilateral)  Patient location during evaluation: Mother Baby Anesthesia Type: Spinal Level of consciousness: awake and alert, oriented and patient cooperative Pain management: pain level controlled Vital Signs Assessment: post-procedure vital signs reviewed and stable Respiratory status: spontaneous breathing Cardiovascular status: stable Postop Assessment: no headache, patient able to bend at knees, no signs of nausea or vomiting and spinal receding Anesthetic complications: no Comments: Pain level 1      Last Vitals:  Filed Vitals:   11/06/15 1500 11/06/15 1622  BP: 113/64 99/59  Pulse: 58 59  Temp: 36.8 C 36.6 C  Resp: 18 18    Last Pain:  Filed Vitals:   11/06/15 1751  PainSc: 2    Pain Goal:                 [facility],[name]

## 2015-11-06 NOTE — Anesthesia Preprocedure Evaluation (Addendum)
Anesthesia Evaluation  Patient identified by MRN, date of birth, ID band Patient awake    Reviewed: Allergy & Precautions, NPO status , Patient's Chart, lab work & pertinent test results  Airway Mallampati: I  TM Distance: >3 FB Neck ROM: Full    Dental  (+) Teeth Intact, Dental Advisory Given   Pulmonary neg pulmonary ROS,    Pulmonary exam normal breath sounds clear to auscultation       Cardiovascular negative cardio ROS Normal cardiovascular exam Rhythm:Regular Rate:Normal     Neuro/Psych negative neurological ROS  negative psych ROS   GI/Hepatic negative GI ROS, Neg liver ROS,   Endo/Other  Hypothyroidism Addison's disease   Renal/GU negative Renal ROS     Musculoskeletal negative musculoskeletal ROS (+)   Abdominal   Peds  Hematology  (+) Blood dyscrasia, anemia , Plt 134k on 11/05/15   Anesthesia Other Findings Day of surgery medications reviewed with the patient.  Reproductive/Obstetrics (+) Pregnancy H/o C/S x2                          Anesthesia Physical Anesthesia Plan  ASA: III  Anesthesia Plan: Spinal   Post-op Pain Management:    Induction:   Airway Management Planned:   Additional Equipment:   Intra-op Plan:   Post-operative Plan:   Informed Consent: I have reviewed the patients History and Physical, chart, labs and discussed the procedure including the risks, benefits and alternatives for the proposed anesthesia with the patient or authorized representative who has indicated his/her understanding and acceptance.   Dental advisory given  Plan Discussed with: CRNA, Anesthesiologist and Surgeon  Anesthesia Plan Comments: (Discussed risks and benefits of and differences between spinal and general. Discussed risks of spinal including headache, backache, failure, bleeding, infection, and nerve damage. Patient consents to spinal. Questions answered. Coagulation  studies and platelet count acceptable.  Dr. Cherly Hensenousins has ordered patient to receive stress dose steroids prior to OR arrival.)      Anesthesia Quick Evaluation

## 2015-11-06 NOTE — Brief Op Note (Addendum)
11/06/2015  12:27 PM  PATIENT:  Heidi Adkins  35 y.o. female  PRE-OPERATIVE DIAGNOSIS:  Previous Cesarean Section x 2, Addison Disease, desires sterilization  POST-OPERATIVE DIAGNOSIS:  Previous Cesarean Section x 2, Addison Disease, desires sterilization  PROCEDURE:  Repeat Cesarean section, kerr hysterotomy, bilateral salpingectomy  SURGEON:  Surgeon(s) and Role: Panel 1:    * Maxie BetterSheronette Colleen Kotlarz, MD - Primary  PHYSICIAN ASSISTANT:   ASSISTANTS: Raelyn Moraolitta Dawson, CNM   ANESTHESIA:   spinal FINDINGS:  Live female LOP, nuchal cord, nl tubes and ovaries, nl placenta. Large mesosalpinx vessels EBL:  Total I/O In: 2100 [I.V.:2100] Out: 700 [Urine:200; Blood:500]  BLOOD ADMINISTERED:none  DRAINS: none   LOCAL MEDICATIONS USED:  MARCAINE     SPECIMEN:  Source of Specimen:  bilateral tubes  DISPOSITION OF SPECIMEN:  PATHOLOGY  COUNTS:  YES  TOURNIQUET:  * No tourniquets in log *  DICTATION: Reubin Milan.Dragon Dictation [id number]  PLAN OF CARE: Admit to inpatient   PATIENT DISPOSITION:  PACU - hemodynamically stable.   Delay start of Pharmacological VTE agent (>24hrs) due to surgical blood loss or risk of bleeding: no

## 2015-11-06 NOTE — Transfer of Care (Signed)
Immediate Anesthesia Transfer of Care Note  Patient: Heidi Adkins  Procedure(s) Performed: Procedure(s) with comments: Repeat CESAREAN SECTION (N/A) - EDD: 11/12/15 BILATERAL TUBAL LIGATION (Bilateral)  Patient Location: PACU  Anesthesia Type:Spinal  Level of Consciousness: awake and alert   Airway & Oxygen Therapy: Patient Spontanous Breathing  Post-op Assessment: Report given to RN and Post -op Vital signs reviewed and stable  Post vital signs: Reviewed  Last Vitals:  Filed Vitals:   11/06/15 0645  BP: 113/68  Pulse: 81  Temp: 36.7 C  Resp: 18    Last Pain: There were no vitals filed for this visit.       Complications: No apparent anesthesia complications

## 2015-11-06 NOTE — Lactation Note (Addendum)
This note was copied from a baby's chart. Lactation Consultation Note  Patient Name: Heidi Adkins [id number]'s Date: 11/06/2015 Reason for consult: Initial assessment   Initial consult at 5 hrs old; GA 39.1; BW 9 lbs, 9.6 oz.  Mother Hx Addison's Disease, Chronic Steroid use, Hx hypothyroidism on Synthroid.   C-Section; Apgars 8/9; EBL-500 ml. Mom is a P3 but first BF experience Infant has attempted to breastfeed x1 (0 min); voids-1; stools-0 since birth [facility]skin [facility] beginning to awaken.  LC offered assistance and mom consented.  Infant easily awoke and began rooting when LC took infant off mom's chest for repositioning for latching.  Tried football hold on right breast but mom was laying too far in bed for comfortable position.   Infant latched on left breast using cross-cradle hold, sandwiching, and asymmetrical latching technique.  Infant took a few sucks then came off and went to sleep.  Taught mom hand expression with return demonstration and observation of colostrum appearing on nipple tip.  Encouraged mom to work on hand expressing into spoon for feeding extra EBM back to infant since infant's BW over 9 lbs to help keep blood sugar levels stable. Taught dad how to assist with BF using teacup hold.  Infant kept acting like he could not find nipple or feel nipple in mouth, biting on gloved finger but would get into a sucking pattern. Discussed with parents if infant continues having difficulty "finding" nipple in mouth, then may need to try a nipple shield for a few feedings to help get infant into a sucking pattern with latching.   After several more attempts infant latched for 10 minutes on left breast. LS-8.    Educated on cluster feeding, feeding cues, and size of infant's stomach. Lactation brochure given and informed of hospital support group and IP and OP services. Encouraged to call for assistance as needed.     Maternal Data Formula Feeding for  Exclusion: No Has patient been taught Hand Expression?: Yes Does the patient have breastfeeding experience prior to this delivery?: No (P3 but first BF experience)  Feeding Feeding Type: Breast Fed Length of feed: 10 min  LATCH Score/Interventions Latch: Grasps breast easily, tongue down, lips flanged, rhythmical sucking.  Audible Swallowing: A few with stimulation  Type of Nipple: Everted at rest and after stimulation  Comfort (Breast/Nipple): Soft / non-tender     Hold (Positioning): Assistance needed to correctly position infant at breast and maintain latch. Intervention(s): Breastfeeding basics reviewed;Support Pillows;Position options;Skin to skin  LATCH Score: 8  Lactation Tools Discussed/Used WIC Program: No   Consult Status Consult Status: Follow-up Date: 11/07/15 Follow-up type: In-patient    Lendon KaVann, Heidi Adkins 11/06/2015, 4:05 PM

## 2015-11-06 NOTE — Anesthesia Procedure Notes (Signed)
Spinal Patient location during procedure: OR Staffing Anesthesiologist: TURK, STEPHEN EDWARD Performed by: anesthesiologist  Preanesthetic Checklist Completed: patient identified, surgical consent, pre-op evaluation, timeout performed, IV checked, risks and benefits discussed and monitors and equipment checked Spinal Block Patient position: sitting Prep: site prepped and draped and DuraPrep Patient monitoring: continuous pulse ox and blood pressure Approach: midline Location: L3-4 Needle Needle type: Pencan  Needle gauge: 24 G Needle length: 9 cm Assessment Sensory level: T4 Additional Notes Functioning IV was confirmed and monitors were applied. Sterile prep and drape, including hand hygiene, mask and sterile gloves were used. The patient was positioned and the spine was prepped. The skin was anesthetized with lidocaine.  Free flow of clear CSF was obtained prior to injecting local anesthetic into the CSF.  The spinal needle aspirated freely following injection.  The needle was carefully withdrawn.  The patient tolerated the procedure well. Consent was obtained prior to procedure with all questions answered and concerns addressed. Risks including but not limited to bleeding, infection, nerve damage, paralysis, failed block, inadequate analgesia, allergic reaction, high spinal, itching and headache were discussed and the patient wished to proceed.   Stephen Turk, MD   

## 2015-11-07 ENCOUNTER — Encounter (HOSPITAL_COMMUNITY): Payer: Self-pay | Admitting: *Deleted

## 2015-11-07 LAB — CBC
HCT: 31.7 % — ABNORMAL LOW (ref 36.0–46.0)
Hemoglobin: 10.2 g/dL — ABNORMAL LOW (ref 12.0–15.0)
MCH: 26.4 pg (ref 26.0–34.0)
MCHC: 32.2 g/dL (ref 30.0–36.0)
MCV: 81.9 fL (ref 78.0–100.0)
PLATELETS: 128 10*3/uL — AB (ref 150–400)
RBC: 3.87 MIL/uL (ref 3.87–5.11)
RDW: 17.4 % — ABNORMAL HIGH (ref 11.5–15.5)
WBC: 15.3 10*3/uL — AB (ref 4.0–10.5)

## 2015-11-07 NOTE — Op Note (Signed)
NAMMarchelle Adkins:  Heidi Adkins                ACCOUNT NO.:  [id number]  MEDICAL RECORD NO.:  [id number]  LOCATION:  9126                          FACILITY:  [facility]  PHYSICIAN:  Maxie BetterSheronette Chibuikem Thang, M.D.DATE OF BIRTH:  18-Nov-1980  DATE OF PROCEDURE:  11/06/2015 DATE OF DISCHARGE:                              OPERATIVE REPORT   PREOPERATIVE DIAGNOSIS:  Previous cesarean section x2, term gestation, desires sterilization.  PROCEDURES:  Repeat cesarean section, Kerr hysterotomy, bilateral salpingectomy.  POSTOPERATIVE DIAGNOSIS:  Previous cesarean section x2, term gestation, desires sterilization.  ANESTHESIA:  Spinal.  SURGEON:  Maxie BetterSheronette Ivelise Castillo, M.D.  ASSISTANT:  Denton Meekolita Dawson, CNM  DESCRIPTION OF PROCEDURE:  Under adequate spinal anesthesia, the patient was placed in the supine position.  She was sterilely prepped and draped in usual fashion.  An indwelling Foley catheter was sterilely placed.  A 0.25% Marcaine was injected along the previous Pfannenstiel skin incision site.  Pfannenstiel skin incision was then made, carried down to the rectus fascia.  Rectus fascia was opened transversely.  The rectus fascia was adherent to the rectus muscles.  Sharp dissection was utilized.  Incidental opening of the parietal peritoneum occurred at that time with that opening.  The rectus fascia was further dissected superiorly, and the parietal peritoneum was opened and then continued up superiorly as well.  The Alexis self-retaining retractor was then placed.  The bladder had adhesions.  The vesicouterine peritoneum was opened with a transverse incision, and the bladder was sharply dissected off the lower uterine segment displaced inferiorly.  A curvilinear low transverse uterine incision was then made and extended with bandage scissors.  Copious amniotic fluid was noted.  Posterior presentation was noted with a nuchal cord noted in the field.  The cord was reduced.  The baby was delivered.  Cord was  easily removed over the head. The baby was bulb suctioned in the abdomen.  Cord was clamped and cut.  The baby was transferred to the awaiting pediatrician, assigned Apgars 8 and 9 at 1 minute and 5 minutes.  The placenta was manually removed.  Uterine cavity was cleaned of debris.  Uterine incision had no extension.  The first uterine incision was closed in two layers, the first layer with 0 Monocryl running locked stitch, the second layer was imbricated with 0 Monocryl suture as well.  The abdomen was irrigated and suctioned of debris.  Attention was then turned to the adnexa.  Both ovaries were noted to be normal.  The patient had large blood vessels bilaterally in the mesosalpinx.  The patient had requested a bilateral salpingectomy. Based on the large vessels, decision was then made to use the LigaSure to facilitate removal of both tubes.  From the left side starting from the distal end, the LigaSure was then used to remove tube with  serially clamped, cauterized, and cut.  When a large vessel was encountered, a 2- 0 Vicryl suture was placed around that and secured that vessel prior to continuing using the LigaSure and then removed the tube on the left side.  The same procedure was performed on the contralateral side with removal of that tube as well.  Good hemostasis was noted.  The prominent vessels  were left alone.  Interceed was placed over the lower uterine segment, and the parietal peritoneum was then closed with 2-0 Vicryl.  The rectus fascia was closed with 0 Vicryl x2.  The subcutaneous area was irrigated, small bleeders were cauterized. Interrupted 2-0 plain suture was placed, and the skin was approximated with staples.  Small oozing was noted.  A pressure dressing was placed.  SPECIMEN:  Bilateral tubes x2, sent to Pathology.  Placenta was not sent.  ESTIMATED BLOOD LOSS:  500 mL.  INTRAOPERATIVE FLUID:  2100 mL.  URINE OUTPUT:  200 mL.  COUNTS:  Sponge and  instrument counts x2 was correct.  COMPLICATION:  None.  DISPOSITION:  The patient tolerated the procedure well, was transferred to Recovery in stable condition. Baby was placed skin to skin.     Maxie Better, M.D.     [place]/MEDQ  D:  11/06/2015  T:  11/07/2015  Job:  [id number]

## 2015-11-07 NOTE — Progress Notes (Signed)
POSTOPERATIVE DAY # 1 S/P CS-repeat  S:         Reports feeling ok             Tolerating po intake / no nausea / no vomiting / + flatus / no BM             Bleeding is light             Pain controlled withmotrin             Up ad lib / ambulatory/ voiding QS  Newborn breast feeding  O:  VS: BP 102/62 mmHg  Pulse 52  Temp(Src) 97.7 F (36.5 C) (Oral)  Resp 16  Ht 5\' 9"  (1.753 m)  Wt 90.719 kg (200 lb)  BMI 29.52 kg/m2  SpO2 100%  LMP 02/05/2015  Breastfeeding? Unknown   LABS:               Recent Labs  11/05/15 1000 11/07/15 0526  WBC 5.7 15.3*  HGB 11.3* 10.2*  PLT 134* 128*               Bloodtype: --/--/O POS (05/05 1000)  Rubella: Immune (10/10 0000)                                             I&O: Intake/Output      05/06 0701 - 05/07 0700 05/07 0701 - 05/08 0700   P.O. 1400    I.V. (mL/kg) 2225 (24.5)    Total Intake(mL/kg) 3625 (40)    Urine (mL/kg/hr) 1075 (0.5)    Blood 540 (0.2)    Total Output 1615     Net +2010                      Physical Exam:             Alert and Oriented X3  Lungs: Clear and unlabored  Heart: regular rate and rhythm / no mumurs  Abdomen: soft, non-tender, non-distended, hypoactive BS             Fundus: firm, non-tender, Ueven             Dressing intact              Incision: no erythema / no ecchymosis / no drainage  Perineum: intact  Lochia: light  Extremities: trace edema, no calf pain or tenderness, SCD  A:        POD # 1 S/P CS            ABL anemia  P:        Routine postoperative care                 Marlinda MikeBAILEY, Arsal Tappan CNM, MSN, [facility] LtdFACNM 11/07/2015, 9:40 AM

## 2015-11-08 ENCOUNTER — Encounter (HOSPITAL_COMMUNITY): Payer: Self-pay | Admitting: Obstetrics and Gynecology

## 2015-11-08 MED ORDER — IBUPROFEN 600 MG PO TABS: 600.0000 mg | ORAL_TABLET | Freq: Four times a day (QID) | ORAL | Status: DC | PRN

## 2015-11-08 NOTE — Progress Notes (Signed)
Patient ID: Loretta PlumeWendy C Grine, female   DOB: 11/22/1980, 35 y.o.   MRN: [id number]  POD # 2  Subjective: Pt reports feeling well and eager for early d/c home / Pain controlled with ibuprofen Tolerating po/Voiding without problems/ No n/v/Flatus pos Activity: out of bed and ambulate Bleeding is light Newborn info:  Information for the patient's newborn:  Debbora Dushomas, Boy Rupa [[id number]][[id number]]  female  / circ performed by Dr Cherly Hensenousins / Feeding: breast and bottle   Objective: VS: Blood pressure 105/64, pulse 55, temperature 98.3 F (36.8 C), temperature source Oral, resp. rate 18.   LABS:  Recent Labs  11/05/15 1000 11/07/15 0526  WBC 5.7 15.3*  HGB 11.3* 10.2*  PLT 134* 128*                             Physical Exam:  General: alert, cooperative and no distress CV: Regular rate and rhythm Resp: clear Abdomen: soft, nontender, normal bowel sounds Incision: Covered with Tegaderm and honeycomb dressing; well approximated. Closed with staples. Uterine Fundus: firm, below umbilicus, nontender Lochia: minimal Ext: Homans sign is negative, no sign of DVT and no edema, redness or tenderness in the calves or thighs    A/P: POD # 3 G3P2003/  S/P Elective rpt C/Section with BTL Mild ABL Anemia Doing well and stable for discharge home RX's: Ibuprofen 600mg  po Q 6 hrs prn pain #30 Refill x 1 Staple removal in the office in 1 week PP visit in 6 wks.    Signed: Demetrius RevelFISHER,Cherika Jessie K, MSN, [facility] 11/08/2015, 9:33 AM

## 2015-11-08 NOTE — Discharge Summary (Signed)
OB Discharge Summary  Patient Name: Loretta PlumeWendy C Ijames DOB: 03/11/1981 MRN: [id number]  Date of admission: 11/06/2015  Admitting diagnosis: Previous C/S x 2, Addison's disease, desires sterilization Intrauterine pregnancy: 3047w1d     Secondary diagnosis: AMA, hypothyroidism   Date of discharge: 11/08/2015     Discharge diagnosis: Term Pregnancy Delivered , s/p rpt C/S with BTL  Prenatal history: G3P3003   EDC : 11/12/2015, by Last Menstrual Period  [facility]  Primary provider : Dr Cherly Hensenousins [facility]  Prenatal Labs: ABO, Rh: --/--/O POS (05/05 1000) Antibody: NEG (05/05 1000) Rubella: Immune (10/10 0000) RPR: Non Reactive (05/05 1000)  HBsAg: Negative (10/10 0000)  HIV: Non-reactive (10/10 0000)  GBS: Negative (04/10 0000)                                    Hospital course:  Sceduled C/S   35 y.o. yo G3P2003 at 3847w1d was admitted to the hospital 11/06/2015 for scheduled cesarean section with the following indication:Elective Repeat.  Membrane Rupture Time/Date: 10:19 AM ,11/06/2015   Patient delivered a Viable infant.11/06/2015  Details of operation can be found in separate operative note.  Pateint had an uncomplicated postpartum course.  She is ambulating, tolerating a regular diet, passing flatus, and urinating well. Patient is discharged home in stable condition on  11/08/2015         Augmentation: none Delivering PROVIDER: Danial Hlavac                                                            Complications: None  Newborn Data: Live born female  Birth Weight: 9 lb 9.6 oz (4355 g) APGAR: 8, 9  Baby Feeding: Breast Disposition:home with mother  Post partum procedures:none    Labs: Lab Results  Component Value Date   WBC 15.3* 11/07/2015   HGB 10.2* 11/07/2015   HCT 31.7* 11/07/2015   MCV 81.9 11/07/2015   PLT 128* 11/07/2015   CMP Latest Ref Rng 11/10/2011  Glucose 70 - 99 mg/dL 409(W116(H)  BUN 6 -  23 mg/dL 8  Creatinine 1.190.50 - 1.471.10 mg/dL 8.290.61  Sodium 562135 - 130145 mEq/L 136  Potassium 3.5 - 5.1 mEq/L 4.2  Chloride 96 - 112 mEq/L 104  CO2 19 - 32 mEq/L 27  Calcium 8.4 - 10.5 mg/dL 8.1(L)  Total Protein 6.0 - 8.3 g/dL 5.2(L)  Total Bilirubin 0.3 - 1.2 mg/dL 8.6(V0.2(L)  Alkaline Phos 39 - 117 U/L 110  AST 0 - 37 U/L 16  ALT 0 - 35 U/L 12    Physical Exam @ time of discharge:  Filed Vitals:   11/07/15 0400 11/07/15 0759 11/07/15 1724 11/08/15 0636  BP: 100/61 102/62 106/73 105/64  Pulse: 62 52 62 55  Temp: 97.6 F (36.4 C) 97.7 F (36.5 C) 97.9 F (36.6 C) 98.3 F (36.8 C)  TempSrc: Oral Oral Oral Oral  Resp: 18 16 18 18   Height:      Weight:      SpO2: 96% 100% 98%     General: alert, cooperative and no distress Lochia: appropriate Uterine Fundus: firm Perineum: not examined Incision: Dressing is clean, dry, and  intact Extremities: no edema DVT Evaluation: No evidence of DVT seen on physical exam. Negative Homan's sign.   Discharge instructions:  "Baby and Me Booklet" and [facility] Booklet  Discharge Medications:    Medication List    TAKE these medications        cetirizine 10 MG tablet  Commonly known as:  ZYRTEC  Take 10 mg by mouth daily as needed for allergies.     hydrocortisone 10 MG tablet  Commonly known as:  CORTEF  Take 10 mg by mouth daily.     ibuprofen 600 MG tablet  Commonly known as:  ADVIL,MOTRIN  Take 1 tablet (600 mg total) by mouth every 6 (six) hours as needed.     IRON PO  Take 1 capsule by mouth 2 (two) times daily.     levothyroxine 75 MCG tablet  Commonly known as:  SYNTHROID, LEVOTHROID  Take 75 mcg by mouth daily.     prenatal multivitamin Tabs tablet  Take 1 tablet by mouth daily.        Diet: routine diet  Activity: Advance as tolerated. Pelvic rest x 6 weeks.   Follow up:6 weeks for routine pp visit and in 1 week for staple removal   Demetrius Revel, MSN, [facility] 11/08/2015, 9:38 AM

## 2018-01-18 ENCOUNTER — Encounter: Payer: Self-pay | Admitting: Internal Medicine

## 2018-01-18 ENCOUNTER — Ambulatory Visit: Payer: BC Managed Care – PPO | Admitting: Internal Medicine

## 2018-01-18 VITALS — BP 112/64 | HR 84 | Temp 99.2°F | Ht 69.0 in | Wt 170.4 lb

## 2018-01-18 DIAGNOSIS — D509 Iron deficiency anemia, unspecified: Secondary | ICD-10-CM

## 2018-01-18 DIAGNOSIS — E271 Primary adrenocortical insufficiency: Secondary | ICD-10-CM | POA: Diagnosis not present

## 2018-01-18 DIAGNOSIS — E611 Iron deficiency: Secondary | ICD-10-CM

## 2018-01-18 DIAGNOSIS — D649 Anemia, unspecified: Secondary | ICD-10-CM | POA: Diagnosis not present

## 2018-01-18 DIAGNOSIS — L309 Dermatitis, unspecified: Secondary | ICD-10-CM

## 2018-01-18 DIAGNOSIS — E039 Hypothyroidism, unspecified: Secondary | ICD-10-CM

## 2018-01-18 DIAGNOSIS — Z0184 Encounter for antibody response examination: Secondary | ICD-10-CM | POA: Diagnosis not present

## 2018-01-18 DIAGNOSIS — Z1389 Encounter for screening for other disorder: Secondary | ICD-10-CM | POA: Diagnosis not present

## 2018-01-18 DIAGNOSIS — E559 Vitamin D deficiency, unspecified: Secondary | ICD-10-CM

## 2018-01-18 DIAGNOSIS — M2550 Pain in unspecified joint: Secondary | ICD-10-CM | POA: Diagnosis not present

## 2018-01-18 DIAGNOSIS — Z13220 Encounter for screening for lipoid disorders: Secondary | ICD-10-CM | POA: Diagnosis not present

## 2018-01-18 DIAGNOSIS — Z1159 Encounter for screening for other viral diseases: Secondary | ICD-10-CM

## 2018-01-18 MED ORDER — HYDROCORTISONE 2.5 % EX OINT: TOPICAL_OINTMENT | Freq: Two times a day (BID) | CUTANEOUS | 11 refills | Status: AC

## 2018-01-18 NOTE — Progress Notes (Addendum)
Chief Complaint  Patient presents with  . Establish Care   NP  1. addisons and hypothyroidism f/u with Dr. Chalmers Cater had labs a few months ago will get records  2. C/o gas and radiates down to joints in legs causing joint pain .   Review of Systems  Constitutional: Negative for weight loss.  HENT: Negative for hearing loss.   Eyes: Negative for blurred vision.  Respiratory: Negative for shortness of breath.   Cardiovascular: Negative for chest pain.  Gastrointestinal: Negative for abdominal pain.  Musculoskeletal: Positive for joint pain.  Skin: Negative for rash.       Mild eczema to left arm and neck   Neurological: Negative for headaches.  Psychiatric/Behavioral: Negative for depression.   Past Medical History:  Diagnosis Date  . Addison's disease ([place])   . Anemia   . Hx of varicella   . Hypothyroidism    hypothyroidism   . Postpartum care following repeat cesarean delivery with bilateral salpingectomy (5/6) 11/06/2015  . Status post bilateral salpingectomy 11/06/2015  . Vaginal Pap smear, abnormal    Past Surgical History:  Procedure Laterality Date  . CESAREAN SECTION    . CESAREAN SECTION  11/08/2011   Procedure: CESAREAN SECTION;  Surgeon: Marvene Staff, MD;  Location: [facility];  Service: Gynecology;  Laterality: N/A;  . CESAREAN SECTION N/A 11/06/2015   Procedure: Repeat CESAREAN SECTION;  Surgeon: Servando Salina, MD;  Location: [facility];  Service: Obstetrics;  Laterality: N/A;  EDD: 11/12/15  . OTHER SURGICAL HISTORY     fallopian tube removal b/l   . TUBAL LIGATION Bilateral 11/06/2015   Procedure: BILATERAL TUBAL LIGATION;  Surgeon: Servando Salina, MD;  Location: [facility];  Service: Obstetrics;  Laterality: Bilateral;   Family History  Problem Relation Age of Onset  . Cancer Sister        cervical vs ovarian ? died age 22 pt stated ovarian   . Diabetes Paternal Grandfather   . Diabetes Father   . Urolithiasis Father   . Hypertension  Father   . Arthritis Father   . Asthma Father   . Hyperlipidemia Father   . Depression Maternal Grandmother   . Hypertension Maternal Grandmother   . Deep vein thrombosis Maternal Grandmother   . Diabetes Maternal Grandmother   . Heart disease Maternal Grandmother   . Hearing loss Maternal Grandmother   . Depression Paternal Grandmother   . Diabetes Paternal Grandmother   . Cancer Paternal Grandmother        breast   . Cancer Maternal Grandfather        colon  . Miscarriages / [place] Mother   . Asthma Son   . Cancer Other        p. great aunts cancer x 2    Social History   Socioeconomic History  . Marital status: Married    Spouse name: Not on file  . Number of children: Not on file  . Years of education: Not on file  . Highest education level: Not on file  Occupational History  . Not on file  Social Needs  . Financial resource strain: Not on file  . Food insecurity:    Worry: Not on file    Inability: Not on file  . Transportation needs:    Medical: Not on file    Non-medical: Not on file  Tobacco Use  . Smoking status: Never Smoker  . Smokeless tobacco: Never Used  Substance and Sexual Activity  . Alcohol  use: No  . Drug use: No  . Sexual activity: Not on file  Lifestyle  . Physical activity:    Days per week: Not on file    Minutes per session: Not on file  . Stress: Not on file  Relationships  . Social connections:    Talks on phone: Not on file    Gets together: Not on file    Attends religious service: Not on file    Active member of club or organization: Not on file    Attends meetings of clubs or organizations: Not on file    Relationship status: Not on file  . Intimate partner violence:    Fear of current or ex partner: Not on file    Emotionally abused: Not on file    Physically abused: Not on file    Forced sexual activity: Not on file  Other Topics Concern  . Not on file  Social History Narrative   Married 3 kids    Current Meds   Medication Sig  . hydrocortisone (CORTEF) 10 MG tablet Take 10 mg by mouth daily.   Marland Kitchen levothyroxine (SYNTHROID, LEVOTHROID) 75 MCG tablet Take 75 mcg by mouth daily.   No Known Allergies No results found for this or any previous visit (from the past 2160 hour(s)). Objective  Body mass index is 25.16 kg/m. Wt Readings from Last 3 Encounters:  01/18/18 170 lb 6.4 oz (77.3 kg)  11/06/15 200 lb (90.7 kg)  11/03/15 200 lb (90.7 kg)   Temp Readings from Last 3 Encounters:  01/18/18 99.2 F (37.3 C) (Oral)  11/08/15 98.3 F (36.8 C) (Oral)  11/10/11 97.7 F (36.5 C) (Oral)   BP Readings from Last 3 Encounters:  01/18/18 112/64  11/08/15 105/64  11/10/11 107/73   Pulse Readings from Last 3 Encounters:  01/18/18 84  11/08/15 (!) 55  11/10/11 67    Physical Exam  Constitutional: She is oriented to person, place, and time. Vital signs are normal. She appears well-developed and well-nourished. She is cooperative.  HENT:  Head: Normocephalic and atraumatic.  Mouth/Throat: Oropharynx is clear and moist and mucous membranes are normal.  Eyes: Pupils are equal, round, and reactive to light. Conjunctivae are normal.  Cardiovascular: Normal rate, regular rhythm and normal heart sounds.  Pulmonary/Chest: Effort normal and breath sounds normal.  Neurological: She is alert and oriented to person, place, and time. Gait normal.  Skin: Skin is warm and dry.  Mild eczema left AC forearm and neck   Psychiatric: She has a normal mood and affect. Her speech is normal and behavior is normal. Judgment and thought content normal. Cognition and memory are normal.  Nursing note and vitals reviewed.   Assessment   1. Addisons 2. Hypothyroidism  3. Arthralgia  4.HM 5. H/o anemia iron 6. eczema Plan   1. And 2. Cont meds f/u with Dr. Chalmers Cater requested records and labs  3. Check labs  4.  Declines flu shot  tdap had 09/01/15 OB/GYN records and proven had 09/21/11  Pap get records [facility]  also MMR titer, vaccines  5. Check labs 6. Prn hc 2.5 oint  Try to get records [facility] Tdap vx  Dr. Chalmers Cater (endocrine) labs and notes  [facility] ob/gyn Dr. Garwin Brothers pap in 2017, MMR titer, labs  [facility]  -obtained records  H/o iron def anemia  ASCUS pap neg HPV/CIN1/LSIL H/o b/l salpingectomy  H/o gestational thrombocytopenia H/o anemia in pregnancy  HIV neg 04/12/15  Pap 04/26/15 negative HPV  negative reviewed [place] [facility] records  H/o O+ blood antibody negative  Rubella Ab 5.45 04/12/15   Reviewed [facility] records last office visit 03/17/15  H/o vit D def 10.1 08/20/13  Tdap had 09/21/11 Right knee Xray neg 09/17/13   Provider: Dr. Olivia Mackie McLean-[facility]

## 2018-01-18 NOTE — Progress Notes (Signed)
Pre visit review using our clinic review tool, if applicable. No additional management support is needed unless otherwise documented below in the visit note. 

## 2018-01-18 NOTE — Patient Instructions (Addendum)
My Risk or My Myriad ask OB/GYN  F/u in 3 months sch fasting labs   Eczema Eczema is a broad term for a group of skin conditions that cause skin to become rough and inflamed. Each type of eczema has different triggers, symptoms, and treatments. Eczema of any type is usually itchy and symptoms range from mild to severe. Eczema and its symptoms are not spread from person to person (are not contagious). It can appear on different parts of the body at different times. Your eczema may not look the same as someone else's eczema. What are the types of eczema? Atopic dermatitis This is a long-term (chronic) skin disease that keeps coming back (recurring). Usual symptoms are dry skin and small, solid pimples that may swell and leak fluid (weep). Contact dermatitis This happens when something irritates the skin and causes a rash. The irritation can come from substances that you are allergic to (allergens), such as poison ivy, chemicals, or medicines that were applied to your skin. Dyshidrotic eczema This is a form of eczema on the hands and feet. It shows up as very itchy, fluid-filled blisters. It can affect people of any age, but is more common before age 2. Hand eczema This causes very itchy areas of skin on the palms and sides of the hands and fingers. This type of eczema is common in industrial jobs where you may be exposed to many different types of irritants. Lichen simplex chronicus This type of eczema occurs when a person constantly scratches one area of the body. Repeated scratching of the area leads to thickened skin (lichenification). Lichen simplex chronicus can occur along with other types of eczema. It is more common in adults, but may be seen in children as well. Nummular eczema This is a common type of eczema. It has no known cause. It typically causes a red, circular, crusty lesion (plaque) that may be itchy. Scratching may become a habit and can cause bleeding. Nummular eczema occurs most  often in people of middle-age or older. It most often affects the hands. Seborrheic dermatitis This is a common skin disease that mainly affects the scalp. It may also affect any oily areas of the body, such as the face, sides of nose, eyebrows, ears, eyelids, and chest. It is marked by small scaling and redness of the skin (erythema). This can affect people of all ages. In infants, this condition is known as Chartered certified accountant." Stasis dermatitis This is a common skin disease that usually appears on the legs and feet. It most often occurs in people who have a condition that prevents blood from being pumped through the veins in the legs (chronic venous insufficiency). Stasis dermatitis is a chronic condition that needs long-term management. How is eczema diagnosed? Your health care provider will examine your skin and review your medical history. He or she may also give you skin patch tests. These tests involve taking patches that contain possible allergens and placing them on your back. He or she will then check in a few days to see if an allergic reaction occurred. What are the common treatments? Treatment for eczema is based on the type of eczema you have. Hydrocortisone steroid medicine can relieve itching quickly and help reduce inflammation. This medicine may be prescribed or obtained over-the-counter, depending on the strength of the medicine that is needed. Follow these instructions at home:  Take over-the-counter and prescription medicines only as told by your health care provider.  Use creams or ointments to moisturize your skin.  Do not use lotions.  Learn what triggers or irritates your symptoms. Avoid these things.  Treat symptom flare-ups quickly.  Do not itch your skin. This can make your rash worse.  Keep all follow-up visits as told by your health care provider. This is important. Where to find more information:  The American Academy of Dermatology: http://jones-macias.info/  The National Eczema  Association: www.nationaleczema.org Contact a health care provider if:  You have serious itching, even with treatment.  You regularly scratch your skin until it bleeds.  Your rash looks different than usual.  Your skin is painful, swollen, or more red than usual.  You have a fever. Summary  There are eight general types of eczema. Each type has different triggers.  Eczema of any type causes itching that may range from mild to severe.  Treatment varies based on the type of eczema you have. Hydrocortisone steroid medicine can help with itching and inflammation.  Protecting your skin is the best way to prevent eczema. Use moisturizers and lotions. Avoid triggers and irritants, and treat flare-ups quickly. This information is not intended to replace advice given to you by your health care provider. Make sure you discuss any questions you have with your health care provider. Document Released: 11/02/2016 Document Revised: 11/02/2016 Document Reviewed: 11/02/2016 Elsevier Interactive Patient Education  2018 [employer].

## 2018-01-24 ENCOUNTER — Other Ambulatory Visit (INDEPENDENT_AMBULATORY_CARE_PROVIDER_SITE_OTHER): Payer: BC Managed Care – PPO

## 2018-01-24 DIAGNOSIS — Z1159 Encounter for screening for other viral diseases: Secondary | ICD-10-CM

## 2018-01-24 DIAGNOSIS — E611 Iron deficiency: Secondary | ICD-10-CM

## 2018-01-24 DIAGNOSIS — M2550 Pain in unspecified joint: Secondary | ICD-10-CM | POA: Diagnosis not present

## 2018-01-24 DIAGNOSIS — Z0184 Encounter for antibody response examination: Secondary | ICD-10-CM

## 2018-01-24 DIAGNOSIS — D649 Anemia, unspecified: Secondary | ICD-10-CM | POA: Diagnosis not present

## 2018-01-24 DIAGNOSIS — E559 Vitamin D deficiency, unspecified: Secondary | ICD-10-CM | POA: Diagnosis not present

## 2018-01-24 DIAGNOSIS — Z13220 Encounter for screening for lipoid disorders: Secondary | ICD-10-CM

## 2018-01-24 DIAGNOSIS — Z1389 Encounter for screening for other disorder: Secondary | ICD-10-CM

## 2018-01-24 LAB — CBC WITH DIFFERENTIAL/PLATELET
BASOS PCT: 0.9 % (ref 0.0–3.0)
Basophils Absolute: 0 10*3/uL (ref 0.0–0.1)
EOS PCT: 9.4 % — AB (ref 0.0–5.0)
Eosinophils Absolute: 0.5 10*3/uL (ref 0.0–0.7)
HEMATOCRIT: 34.1 % — AB (ref 36.0–46.0)
Hemoglobin: 10.6 g/dL — ABNORMAL LOW (ref 12.0–15.0)
Lymphs Abs: 2.7 10*3/uL (ref 0.7–4.0)
MCHC: 31.2 g/dL (ref 30.0–36.0)
MCV: 71.2 fl — ABNORMAL LOW (ref 78.0–100.0)
MONOS PCT: 10.7 % (ref 3.0–12.0)
Monocytes Absolute: 0.5 10*3/uL (ref 0.1–1.0)
NEUTROS ABS: 1.1 10*3/uL — AB (ref 1.4–7.7)
Neutrophils Relative %: 22.2 % — ABNORMAL LOW (ref 43.0–77.0)
PLATELETS: 163 10*3/uL (ref 150.0–400.0)
RBC: 4.79 Mil/uL (ref 3.87–5.11)
RDW: 17.3 % — AB (ref 11.5–15.5)
WBC: 4.8 10*3/uL (ref 4.0–10.5)

## 2018-01-24 LAB — LIPID PANEL
CHOL/HDL RATIO: 2
Cholesterol: 138 mg/dL (ref 0–200)
HDL: 55.3 mg/dL (ref >39.00)
LDL CALC: 70 mg/dL (ref 0–99)
NonHDL: 82.33
TRIGLYCERIDES: 60 mg/dL (ref 0.0–149.0)
VLDL: 12 mg/dL (ref 0.0–40.0)

## 2018-01-24 LAB — C-REACTIVE PROTEIN: CRP: 0.1 mg/dL — AB (ref 0.5–20.0)

## 2018-01-24 LAB — SEDIMENTATION RATE: Sed Rate: 14 mm/hr (ref 0–20)

## 2018-01-24 LAB — VITAMIN D 25 HYDROXY (VIT D DEFICIENCY, FRACTURES): VITD: 18.89 ng/mL — ABNORMAL LOW (ref 30.00–100.00)

## 2018-01-25 ENCOUNTER — Encounter: Payer: Self-pay | Admitting: Internal Medicine

## 2018-01-25 ENCOUNTER — Other Ambulatory Visit: Payer: Self-pay | Admitting: Internal Medicine

## 2018-01-25 DIAGNOSIS — E559 Vitamin D deficiency, unspecified: Secondary | ICD-10-CM | POA: Insufficient documentation

## 2018-01-25 DIAGNOSIS — D509 Iron deficiency anemia, unspecified: Secondary | ICD-10-CM

## 2018-01-25 LAB — URINALYSIS, ROUTINE W REFLEX MICROSCOPIC
Bilirubin, UA: NEGATIVE
Glucose, UA: NEGATIVE
Ketones, UA: NEGATIVE
LEUKOCYTES UA: NEGATIVE
Nitrite, UA: NEGATIVE
PH UA: 5.5 (ref 5.0–7.5)
Protein, UA: NEGATIVE
RBC, UA: NEGATIVE
Specific Gravity, UA: 1.027 (ref 1.005–1.030)
Urobilinogen, Ur: 1 mg/dL (ref 0.2–1.0)

## 2018-01-25 MED ORDER — CHOLECALCIFEROL 1.25 MG (50000 UT) PO CAPS: 50000.0000 [IU] | ORAL_CAPSULE | ORAL | 1 refills | Status: DC

## 2018-01-28 ENCOUNTER — Telehealth: Payer: Self-pay | Admitting: Internal Medicine

## 2018-01-28 NOTE — Telephone Encounter (Signed)
[facility].

## 2018-01-28 NOTE — Telephone Encounter (Signed)
[facility].

## 2018-01-29 ENCOUNTER — Other Ambulatory Visit: Payer: Self-pay | Admitting: Internal Medicine

## 2018-01-29 ENCOUNTER — Encounter: Payer: Self-pay | Admitting: Internal Medicine

## 2018-01-29 LAB — IRON,TIBC AND FERRITIN PANEL
%SAT: 13 % (calc) — ABNORMAL LOW (ref 16–45)
Ferritin: 9 ng/mL — ABNORMAL LOW (ref 16–154)
Iron: 39 ug/dL — ABNORMAL LOW (ref 40–190)
TIBC: 309 ug/dL (ref 250–450)

## 2018-01-29 LAB — ANTI-NUCLEAR AB-TITER (ANA TITER): ANA Titer 1: 1:80 {titer} — ABNORMAL HIGH

## 2018-01-29 LAB — ANA: ANA: POSITIVE — AB

## 2018-01-29 LAB — CYCLIC CITRUL PEPTIDE ANTIBODY, IGG

## 2018-01-29 LAB — RHEUMATOID FACTOR: Rhuematoid fact SerPl-aCnc: <14 IU/mL (ref <14)

## 2018-01-29 LAB — HEPATITIS B SURFACE ANTIBODY, QUANTITATIVE: Hep B S AB Quant (Post): <5 m[IU]/mL — ABNORMAL LOW (ref >=10)

## 2018-02-04 ENCOUNTER — Other Ambulatory Visit: Payer: Self-pay | Admitting: Internal Medicine

## 2018-02-04 DIAGNOSIS — R768 Other specified abnormal immunological findings in serum: Secondary | ICD-10-CM

## 2018-02-07 ENCOUNTER — Ambulatory Visit (INDEPENDENT_AMBULATORY_CARE_PROVIDER_SITE_OTHER): Payer: BC Managed Care – PPO | Admitting: Internal Medicine

## 2018-02-07 ENCOUNTER — Encounter: Payer: Self-pay | Admitting: Internal Medicine

## 2018-02-07 ENCOUNTER — Other Ambulatory Visit (HOSPITAL_COMMUNITY)
Admission: RE | Admit: 2018-02-07 | Discharge: 2018-02-07 | Disposition: A | Discharge status: Home / Self Care | Payer: BC Managed Care – PPO | Source: Ambulatory Visit | Attending: Internal Medicine | Admitting: Internal Medicine

## 2018-02-07 VITALS — BP 98/56 | HR 77 | Temp 98.3°F | Ht 69.0 in | Wt 169.4 lb

## 2018-02-07 DIAGNOSIS — Z1151 Encounter for screening for human papillomavirus (HPV): Secondary | ICD-10-CM | POA: Insufficient documentation

## 2018-02-07 DIAGNOSIS — D509 Iron deficiency anemia, unspecified: Secondary | ICD-10-CM

## 2018-02-07 DIAGNOSIS — Z124 Encounter for screening for malignant neoplasm of cervix: Secondary | ICD-10-CM | POA: Diagnosis not present

## 2018-02-07 DIAGNOSIS — Z87898 Personal history of other specified conditions: Secondary | ICD-10-CM

## 2018-02-07 DIAGNOSIS — Z23 Encounter for immunization: Secondary | ICD-10-CM

## 2018-02-07 DIAGNOSIS — Z0000 Encounter for general adult medical examination without abnormal findings: Secondary | ICD-10-CM

## 2018-02-07 DIAGNOSIS — E559 Vitamin D deficiency, unspecified: Secondary | ICD-10-CM

## 2018-02-07 DIAGNOSIS — Z8742 Personal history of other diseases of the female genital tract: Secondary | ICD-10-CM

## 2018-02-07 DIAGNOSIS — R768 Other specified abnormal immunological findings in serum: Secondary | ICD-10-CM

## 2018-02-07 NOTE — Progress Notes (Signed)
Pre visit review using our clinic review tool, if applicable. No additional management support is needed unless otherwise documented below in the visit note. 

## 2018-02-07 NOTE — Progress Notes (Signed)
Chief Complaint  Patient presents with  . Annual Exam   Annual doing well  1. Reviewed labs ANA + titer 1:80 FH fibromyalgia mom and FH cancer disc genetic testing pt will disc with ob/gyn    Review of Systems  Constitutional: Negative for weight loss.  HENT: Negative for hearing loss.   Eyes: Negative for blurred vision.  Respiratory: Negative for shortness of breath.   Cardiovascular: Negative for chest pain.  Gastrointestinal: Negative for abdominal pain.  Musculoskeletal: Negative for joint pain.  Skin: Negative for rash.  Neurological: Negative for headaches.  Psychiatric/Behavioral: Negative for depression.   Past Medical History:  Diagnosis Date  . Addison's disease ([place])   . Anemia   . Hx of varicella   . Hypothyroidism    hypothyroidism   . Postpartum care following repeat cesarean delivery with bilateral salpingectomy (5/6) 11/06/2015  . Status post bilateral salpingectomy 11/06/2015  . Vaginal Pap smear, abnormal    Past Surgical History:  Procedure Laterality Date  . CESAREAN SECTION    . CESAREAN SECTION  11/08/2011   Procedure: CESAREAN SECTION;  Surgeon: Marvene Staff, MD;  Location: [facility];  Service: Gynecology;  Laterality: N/A;  . CESAREAN SECTION N/A 11/06/2015   Procedure: Repeat CESAREAN SECTION;  Surgeon: Servando Salina, MD;  Location: [place];  Service: Obstetrics;  Laterality: N/A;  EDD: 11/12/15  . OTHER SURGICAL HISTORY     fallopian tube removal b/l   . TUBAL LIGATION Bilateral 11/06/2015   Procedure: BILATERAL TUBAL LIGATION;  Surgeon: Servando Salina, MD;  Location: [facility];  Service: Obstetrics;  Laterality: Bilateral;   Family History  Problem Relation Age of Onset  . Cancer Sister        cervical vs ovarian ? died age 2 pt stated ovarian   . Diabetes Paternal Grandfather   . Diabetes Father   . Urolithiasis Father   . Hypertension Father   . Arthritis Father   . Asthma Father   . Hyperlipidemia Father   .  Depression Maternal Grandmother   . Hypertension Maternal Grandmother   . Deep vein thrombosis Maternal Grandmother   . Diabetes Maternal Grandmother   . Heart disease Maternal Grandmother   . Hearing loss Maternal Grandmother   . Depression Paternal Grandmother   . Diabetes Paternal Grandmother   . Cancer Paternal Grandmother        breast   . Cancer Maternal Grandfather        colon  . Miscarriages / [place] Mother   . Asthma Son   . Cancer Other        p. great aunts cancer x 2    Social History   Socioeconomic History  . Marital status: Married    Spouse name: Not on file  . Number of children: Not on file  . Years of education: Not on file  . Highest education level: Not on file  Occupational History  . Not on file  Social Needs  . Financial resource strain: Not on file  . Food insecurity:    Worry: Not on file    Inability: Not on file  . Transportation needs:    Medical: Not on file    Non-medical: Not on file  Tobacco Use  . Smoking status: Never Smoker  . Smokeless tobacco: Never Used  Substance and Sexual Activity  . Alcohol use: No  . Drug use: No  . Sexual activity: Not on file  Lifestyle  . Physical activity:  Days per week: Not on file    Minutes per session: Not on file  . Stress: Not on file  Relationships  . Social connections:    Talks on phone: Not on file    Gets together: Not on file    Attends religious service: Not on file    Active member of club or organization: Not on file    Attends meetings of clubs or organizations: Not on file    Relationship status: Not on file  . Intimate partner violence:    Fear of current or ex partner: Not on file    Emotionally abused: Not on file    Physically abused: Not on file    Forced sexual activity: Not on file  Other Topics Concern  . 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 Cholecalciferol 50000 units capsule Take 1 capsule (50,000 Units total) by mouth once a week.  . hydrocortisone (CORTEF) 10 MG tablet Take 10 mg by mouth daily.   . hydrocortisone 2.5 % ointment Apply topically 2 (two) times daily. Bid prn  . levothyroxine (SYNTHROID, LEVOTHROID) 75 MCG tablet Take 75 mcg by mouth daily.   No Known Allergies Recent Results (from the past 2160 hour(s))  Antinuclear Antib (ANA)     Status: Abnormal   Collection Time: 01/24/18  8:01 AM  Result Value Ref Range   Anti Nuclear Antibody(ANA) POSITIVE (A) NEGATIVE    Comment: ANA IFA is a first line screen for detecting the presence of up to approximately 150 autoantibodies in various autoimmune diseases. A positive ANA IFA result is suggestive of autoimmune disease and reflexes to titer and pattern. Further laboratory testing may be considered if clinically indicated. . Visit Physician FAQs for interpretation of all antibodies in the [facility], prevalence, and association with diseases at http://education.[facility].com/ QZE/SPQ330 .   Cyclic citrul peptide antibody, IgG     Status: None   Collection Time: 01/24/18  8:01 AM  Result Value Ref Range   Cyclic Citrullin Peptide Ab <16 UNITS    Comment: Reference Range Negative:            <20 Weak Positive:       20-39 Moderate Positive:   40-59 Strong Positive:     >59 .   C-reactive protein     Status: Abnormal   Collection Time: 01/24/18  8:01 AM  Result Value Ref Range   CRP 0.1 (L) 0.5 - 20.0 mg/dL  Sedimentation rate     Status: None   Collection Time: 01/24/18  8:01 AM  Result Value Ref Range   Sed Rate 14 0 - 20 mm/hr  Rheumatoid Factor     Status: None   Collection Time: 01/24/18  8:01 AM  Result Value Ref Range   Rhuematoid fact SerPl-aCnc <14 <14 IU/mL  Hepatitis B surface antibody     Status: Abnormal   Collection Time:  01/24/18  8:01 AM  Result Value Ref Range   Hepatitis B-Post <5 (L) > OR = 10 mIU/mL    Comment: . Patient does not have immunity to hepatitis B virus. . For additional information, please refer to http://education.[facility].com/faq/FAQ105 (This link is being provided for informational/ educational  purposes only).   Vitamin D (25 hydroxy)     Status: Abnormal   Collection Time: 01/24/18  8:01 AM  Result Value Ref Range   VITD 18.89 (L) 30.00 - 100.00 ng/mL  Lipid panel     Status: None   Collection Time: 01/24/18  8:01 AM  Result Value Ref Range   Cholesterol 138 0 - 200 mg/dL    Comment: ATP III Classification       Desirable:  < 200 mg/dL               Borderline High:  200 - 239 mg/dL          High:  > = 240 mg/dL   Triglycerides 60.0 0.0 - 149.0 mg/dL    Comment: Normal:  <150 mg/dLBorderline High:  150 - 199 mg/dL   HDL 55.30 >39.00 mg/dL   VLDL 12.0 0.0 - 40.0 mg/dL   LDL Cholesterol 70 0 - 99 mg/dL   Total CHOL/HDL Ratio 2     Comment:                Men          Women1/2 Average Risk     3.4          3.3Average Risk          5.0          4.42X Average Risk          9.6          7.13X Average Risk          15.0          11.0                       NonHDL 82.33     Comment: NOTE:  Non-HDL goal should be 30 mg/dL higher than patient's LDL goal (i.e. LDL goal of < 70 mg/dL, would have non-HDL goal of < 100 mg/dL)  Iron, TIBC and Ferritin Panel     Status: Abnormal   Collection Time: 01/24/18  8:01 AM  Result Value Ref Range   Iron 39 (L) 40 - 190 mcg/dL   TIBC 309 250 - 450 mcg/dL (calc)   %SAT 13 (L) 16 - 45 % (calc)   Ferritin 9 (L) 16 - 154 ng/mL  CBC with Differential/Platelet     Status: Abnormal   Collection Time: 01/24/18  8:01 AM  Result Value Ref Range   WBC 4.8 4.0 - 10.5 K/uL   RBC 4.79 3.87 - 5.11 Mil/uL   Hemoglobin 10.6 (L) 12.0 - 15.0 g/dL   HCT 34.1 (L) 36.0 - 46.0 %   MCV 71.2 (L) 78.0 - 100.0 fl   MCHC 31.2 30.0 - 36.0 g/dL   RDW 17.3 (H) 11.5  - 15.5 %   Platelets 163.0 150.0 - 400.0 K/uL   Neutrophils Relative % 22.2 (L) 43.0 - 77.0 %   Lymphocytes Relative 56.8 Repeated and verified X2. (H) 12.0 - 46.0 %   Monocytes Relative 10.7 3.0 - 12.0 %   Eosinophils Relative 9.4 (H) 0.0 - 5.0 %   Basophils Relative 0.9 0.0 - 3.0 %   Neutro Abs 1.1 (L) 1.4 - 7.7 K/uL   Lymphs Abs 2.7 0.7 - 4.0 K/uL   Monocytes Absolute 0.5 0.1 - 1.0 K/uL   Eosinophils Absolute 0.5 0.0 - 0.7 K/uL   Basophils Absolute 0.0 0.0 - 0.1 K/uL  Anti-nuclear ab-titer (ANA titer)  Status: Abnormal   Collection Time: 01/24/18  8:01 AM  Result Value Ref Range   ANA Pattern 1 HOMOGENEOUS (A)     Comment: Homogeneous pattern is associated with systemic lupus erythematosus (SLE), drug induced lupus and juvenile idiopathic arthritis.    ANA Titer 1 1:80 (H) titer    Comment: A low level ANA titer may be present in pre-clinical autoimmune diseases and normal individuals.                 Reference Range                 <1:40        Negative                 1:40-1:80    Low Antibody Level                 >1:80        Elevated Antibody Level .   Urinalysis, Routine w reflex microscopic     Status: Abnormal   Collection Time: 01/24/18  8:02 AM  Result Value Ref Range   Specific Gravity, UA 1.027 1.005 - 1.030   pH, UA 5.5 5.0 - 7.5   Color, UA Yellow Yellow   Appearance Ur Cloudy (A) Clear   Leukocytes, UA Negative Negative   Protein, UA Negative Negative/Trace   Glucose, UA Negative Negative   Ketones, UA Negative Negative   RBC, UA Negative Negative   Bilirubin, UA Negative Negative   Urobilinogen, Ur 1.0 0.2 - 1.0 mg/dL   Nitrite, UA Negative Negative   Microscopic Examination Comment     Comment: Microscopic not indicated and not performed.   Objective  Body mass index is 25.02 kg/m. Wt Readings from Last 3 Encounters:  02/07/18 169 lb 6.4 oz (76.8 kg)  01/18/18 170 lb 6.4 oz (77.3 kg)  11/06/15 200 lb (90.7 kg)   Temp Readings from Last 3  Encounters:  02/07/18 98.3 F (36.8 C) (Oral)  01/18/18 99.2 F (37.3 C) (Oral)  11/08/15 98.3 F (36.8 C) (Oral)   BP Readings from Last 3 Encounters:  02/07/18 (!) 98/56  01/18/18 112/64  11/08/15 105/64   Pulse Readings from Last 3 Encounters:  02/07/18 77  01/18/18 84  11/08/15 (!) 55    Physical Exam  Constitutional: She is oriented to person, place, and time. Vital signs are normal. She appears well-developed and well-nourished. She is cooperative.  HENT:  Head: Normocephalic and atraumatic.  Mouth/Throat: Oropharynx is clear and moist and mucous membranes are normal.  Eyes: Pupils are equal, round, and reactive to light. Conjunctivae are normal.  Cardiovascular: Normal rate, regular rhythm and normal heart sounds.  Pulmonary/Chest: Effort normal and breath sounds normal. Right breast exhibits no inverted nipple, no mass, no nipple discharge, no skin change and no tenderness. Left breast exhibits no inverted nipple, no mass, no nipple discharge, no skin change and no tenderness. No breast swelling, tenderness, discharge or bleeding. Breasts are symmetrical.  Genitourinary: Vagina normal and uterus normal. Pelvic exam was performed with patient supine. Cervix exhibits discharge. Cervix exhibits no motion tenderness and no friability. Right adnexum displays no mass, no tenderness and no fullness. Left adnexum displays no mass, no tenderness and no fullness.  Genitourinary Comments: White thick discharge    Neurological: She is alert and oriented to person, place, and time. Gait normal.  Skin: Skin is warm, dry and intact.  Psychiatric: She has a normal mood and affect. Her speech is  normal and behavior is normal. Judgment and thought content normal. Cognition and memory are normal.  Nursing note and vitals reviewed.   Assessment   1. Annual  2 +ANA Plan   1.  Reviewed labs  Cont healthy diet choices  Declines flu shot  tdap had 09/01/15 (per pt had) OB/GYN records and  proven had 09/21/11  Breast exam and pap today h/o abnormal pap  rec iron 325 mg daily and vitamin D for now 50K weekly x 6 months then weekly 5000 IU Disc my myriad, my risk and brca testing pt will disc with ob/GYN 2. Pending rheumatology referral  Provider: Dr. Olivia Mackie McLean-Scocuzza-Internal Medicine

## 2018-02-07 NOTE — Patient Instructions (Addendum)
Starting month 7 take D3 over the counter 5000 IU daily  Natures own, centrum vitamins with iron/ferrous sulfate 325 mg daily at least  Or vitamin from [employer]  rec disc My Myriad, My Risk or BRCA genetic testing with OB/GYN  Follow up in 4-6 months   Hepatitis B Vaccine, Recombinant injection What is this medicine? HEPATITIS B VACCINE (hep uh TAHY tis B VAK seen) is a vaccine. It is used to prevent an infection with the hepatitis B virus. This medicine may be used for other purposes; ask your health care provider or pharmacist if you have questions. COMMON BRAND NAME(S): Engerix-B, Recombivax HB What should I tell my health care provider before I take this medicine? They need to know if you have any of these conditions: -fever, infection -heart disease -hepatitis B infection -immune system problems -kidney disease -an unusual or allergic reaction to vaccines, yeast, other medicines, foods, dyes, or preservatives -pregnant or trying to get pregnant -breast-feeding How should I use this medicine? This vaccine is for injection into a muscle. It is given by a health care professional. A copy of Vaccine Information Statements will be given before each vaccination. Read this sheet carefully each time. The sheet may change frequently. Talk to your pediatrician regarding the use of this medicine in children. While this drug may be prescribed for children as young as newborn for selected conditions, precautions do apply. Overdosage: If you think you have taken too much of this medicine contact a poison control center or emergency room at once. NOTE: This medicine is only for you. Do not share this medicine with others. What if I miss a dose? It is important not to miss your dose. Call your doctor or health care professional if you are unable to keep an appointment. What may interact with this medicine? -medicines that suppress your immune function like adalimumab, anakinra,  infliximab -medicines to treat cancer -steroid medicines like prednisone or cortisone This list may not describe all possible interactions. Give your health care provider a list of all the medicines, herbs, non-prescription drugs, or dietary supplements you use. Also tell them if you smoke, drink alcohol, or use illegal drugs. Some items may interact with your medicine. What should I watch for while using this medicine? See your health care provider for all shots of this vaccine as directed. You must have 3 shots of this vaccine for protection from hepatitis B infection. Tell your doctor right away if you have any serious or unusual side effects after getting this vaccine. What side effects may I notice from receiving this medicine? Side effects that you should report to your doctor or health care professional as soon as possible: -allergic reactions like skin rash, itching or hives, swelling of the face, lips, or tongue -breathing problems -confused, irritated -fast, irregular heartbeat -flu-like syndrome -numb, tingling pain -seizures -unusually weak or tired Side effects that usually do not require medical attention (report to your doctor or health care professional if they continue or are bothersome): -diarrhea -fever -headache -loss of appetite -muscle pain -nausea -pain, redness, swelling, or irritation at site where injected -tiredness This list may not describe all possible side effects. Call your doctor for medical advice about side effects. You may report side effects to FDA at 1-800-FDA-1088. Where should I keep my medicine? This drug is given in a hospital or clinic and will not be stored at home. NOTE: This sheet is a summary. It may not cover all possible information. If you have  questions about this medicine, talk to your doctor, pharmacist, or health care provider.  2018 Elsevier/Gold Standard (2013-10-20 13:26:01)

## 2018-02-08 ENCOUNTER — Other Ambulatory Visit: Payer: Self-pay | Admitting: Internal Medicine

## 2018-02-08 DIAGNOSIS — B9689 Other specified bacterial agents as the cause of diseases classified elsewhere: Secondary | ICD-10-CM

## 2018-02-08 DIAGNOSIS — N760 Acute vaginitis: Principal | ICD-10-CM

## 2018-02-08 LAB — CYTOLOGY - PAP
BACTERIAL VAGINITIS: POSITIVE — AB
Candida vaginitis: NEGATIVE
DIAGNOSIS: NEGATIVE
HPV: NOT DETECTED

## 2018-02-08 MED ORDER — METRONIDAZOLE 500 MG PO TABS
500.0000 mg | ORAL_TABLET | Freq: Two times a day (BID) | ORAL | 0 refills | Status: DC
Start: 2018-02-08 — End: 2021-09-07

## 2018-02-11 ENCOUNTER — Encounter: Payer: Self-pay | Admitting: *Deleted

## 2018-03-12 ENCOUNTER — Ambulatory Visit (INDEPENDENT_AMBULATORY_CARE_PROVIDER_SITE_OTHER): Payer: BC Managed Care – PPO | Admitting: *Deleted

## 2018-03-12 DIAGNOSIS — Z23 Encounter for immunization: Secondary | ICD-10-CM

## 2018-03-22 ENCOUNTER — Ambulatory Visit: Payer: BC Managed Care – PPO | Admitting: Internal Medicine

## 2018-04-24 ENCOUNTER — Ambulatory Visit: Payer: BC Managed Care – PPO | Admitting: Internal Medicine

## 2018-05-10 ENCOUNTER — Ambulatory Visit: Payer: BC Managed Care – PPO | Admitting: Internal Medicine

## 2018-06-11 ENCOUNTER — Ambulatory Visit: Payer: BC Managed Care – PPO | Admitting: Internal Medicine

## 2018-06-20 ENCOUNTER — Telehealth: Payer: Self-pay | Admitting: Internal Medicine

## 2018-06-20 ENCOUNTER — Encounter: Payer: Self-pay | Admitting: Internal Medicine

## 2018-06-20 ENCOUNTER — Ambulatory Visit: Payer: BC Managed Care – PPO | Admitting: Internal Medicine

## 2018-06-20 VITALS — BP 106/64 | HR 84 | Temp 98.1°F | Ht 69.0 in | Wt 176.8 lb

## 2018-06-20 DIAGNOSIS — R768 Other specified abnormal immunological findings in serum: Secondary | ICD-10-CM

## 2018-06-20 DIAGNOSIS — J309 Allergic rhinitis, unspecified: Secondary | ICD-10-CM

## 2018-06-20 DIAGNOSIS — D509 Iron deficiency anemia, unspecified: Secondary | ICD-10-CM | POA: Diagnosis not present

## 2018-06-20 DIAGNOSIS — E559 Vitamin D deficiency, unspecified: Secondary | ICD-10-CM | POA: Diagnosis not present

## 2018-06-20 DIAGNOSIS — E271 Primary adrenocortical insufficiency: Secondary | ICD-10-CM

## 2018-06-20 DIAGNOSIS — E039 Hypothyroidism, unspecified: Secondary | ICD-10-CM

## 2018-06-20 MED ORDER — MONTELUKAST SODIUM 10 MG PO TABS: 10.0000 mg | ORAL_TABLET | Freq: Every day | ORAL | 3 refills | Status: DC

## 2018-06-20 NOTE — Telephone Encounter (Signed)
Inform pt name of company who will come to inspect home for mold, environmental triggers for allergies in home is [employer] [phone] [place]   TMS

## 2018-06-20 NOTE — Progress Notes (Signed)
Chief Complaint  Patient presents with  . Follow-up   F/u with 3 kids  1. C/o reduced sense of smell, cough, runny nose and allergies though she does not know what she is allergic to. 1 of her kids have been sick with URI sx's, another has asthma and recently w/in the last 8 months they have moved into a new house and everyone seems to be having allergic sxs. They are getting the carpet removed to see if this helps  2. Hypothyroidism and addisons she has upcoming appt with endocrine 08/2018  3. +ANA-She never f/u with rheumatology   01/24/2018 08:01 Anti Nuclear Antibody(ANA): POSITIVE (A) ANA Pattern 1: HOMOGENEOUS (A) ANA Titer 1: 8:54 (H) Cyclic Citrullin Peptide Ab: <16 RA Latex Turbid.: <14  4. Iron def anemia she will check to see if mvt has iron in it  5. Vitamin D def was Rx 50K weekly D3   Review of Systems  Constitutional: Negative for fever and malaise/fatigue.  HENT: Positive for congestion.        +runny nose    Eyes: Negative for blurred vision.  Respiratory: Positive for cough.   Cardiovascular: Negative for chest pain.  Skin: Negative for rash.  Neurological: Negative for headaches.  Endo/Heme/Allergies: Positive for environmental allergies.   Past Medical History:  Diagnosis Date  . Addison's disease ([place])   . Anemia   . Hx of varicella   . Hypothyroidism    hypothyroidism   . Postpartum care following repeat cesarean delivery with bilateral salpingectomy (5/6) 11/06/2015  . Status post bilateral salpingectomy 11/06/2015  . Vaginal Pap smear, abnormal    Past Surgical History:  Procedure Laterality Date  . CESAREAN SECTION    . CESAREAN SECTION  11/08/2011   Procedure: CESAREAN SECTION;  Surgeon: Marvene Staff, MD;  Location: [facility];  Service: Gynecology;  Laterality: N/A;  . CESAREAN SECTION N/A 11/06/2015   Procedure: Repeat CESAREAN SECTION;  Surgeon: Servando Salina, MD;  Location: [facility];  Service: Obstetrics;  Laterality: N/A;  EDD:  11/12/15  . OTHER SURGICAL HISTORY     fallopian tube removal b/l   . TUBAL LIGATION Bilateral 11/06/2015   Procedure: BILATERAL TUBAL LIGATION;  Surgeon: Servando Salina, MD;  Location: [facility];  Service: Obstetrics;  Laterality: Bilateral;   Family History  Problem Relation Age of Onset  . Cancer Sister        cervical vs ovarian ? died age 42 pt stated ovarian   . Diabetes Paternal Grandfather   . Diabetes Father   . Urolithiasis Father   . Hypertension Father   . Arthritis Father   . Asthma Father   . Hyperlipidemia Father   . Depression Maternal Grandmother   . Hypertension Maternal Grandmother   . Deep vein thrombosis Maternal Grandmother   . Diabetes Maternal Grandmother   . Heart disease Maternal Grandmother   . Hearing loss Maternal Grandmother   . Depression Paternal Grandmother   . Diabetes Paternal Grandmother   . Cancer Paternal Grandmother        breast   . Cancer Maternal Grandfather        colon  . Miscarriages / [place] Mother   . Fibromyalgia Mother   . Asthma Son   . Cancer Other        p. great aunts cancer x 2    Social History   Socioeconomic History  . Marital status: Married    Spouse name: Not on file  . Number of  children: Not on file  . Years of education: Not on file  . Highest education level: Not on file  Occupational History  . Not on file  Social Needs  . Financial resource strain: Not on file  . Food insecurity:    Worry: Not on file    Inability: Not on file  . Transportation needs:    Medical: Not on file    Non-medical: Not on file  Tobacco Use  . Smoking status: Never Smoker  . Smokeless tobacco: Never Used  Substance and Sexual Activity  . Alcohol use: No  . Drug use: No  . Sexual activity: Not on file  Lifestyle  . Physical activity:    Days per week: Not on file    Minutes per session: Not on file  . Stress: Not on file  Relationships  . Social connections:    Talks on phone: Not on file    Gets  together: Not on file    Attends religious service: Not on file    Active member of club or organization: Not on file    Attends meetings of clubs or organizations: Not on file    Relationship status: Not on file  . Intimate partner violence:    Fear of current or ex partner: Not on file    Emotionally abused: Not on file    Physically abused: Not on file    Forced sexual activity: Not on file  Other Topics Concern  . 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 Cholecalciferol 50000 units capsule Take 1 capsule (50,000 Units total) by mouth once a week.  . hydrocortisone (CORTEF) 10 MG tablet Take 10 mg by mouth daily.   . hydrocortisone 2.5 % ointment Apply topically 2 (two) times daily. Bid prn  . levothyroxine (SYNTHROID, LEVOTHROID) 75 MCG tablet Take 75 mcg by mouth daily.  . metroNIDAZOLE (FLAGYL) 500 MG tablet Take 1 tablet (500 mg total) by mouth 2 (two) times daily. With food   No Known Allergies No results found for this or any previous visit (from the past 2160 hour(s)). Objective  Body mass index is 26.11 kg/m. Wt Readings from Last 3 Encounters:  06/20/18 176 lb 12.8 oz (80.2 kg)  02/07/18 169 lb 6.4 oz (76.8 kg)  01/18/18 170 lb 6.4 oz (77.3 kg)   Temp Readings from Last 3 Encounters:  06/20/18 98.1 F (36.7 C) (Oral)  02/07/18 98.3 F (36.8 C) (Oral)  01/18/18 99.2 F (37.3 C) (Oral)   BP Readings from Last 3 Encounters:  06/20/18 106/64  02/07/18 (!) 98/56  01/18/18 112/64   Pulse Readings from Last 3 Encounters:  06/20/18 84  02/07/18 77  01/18/18 84    Physical Exam Vitals signs and nursing note reviewed.  Constitutional:      Appearance: Normal appearance.  HENT:     Head: Normocephalic and atraumatic.     Mouth/Throat:     Mouth:  Mucous membranes are moist.     Pharynx: Oropharynx is clear.  Eyes:     Conjunctiva/sclera: Conjunctivae normal.     Pupils: Pupils are equal, round, and reactive to light.  Cardiovascular:     Rate and Rhythm:  Normal rate and regular rhythm.     Heart sounds: Normal heart sounds.  Pulmonary:     Effort: Pulmonary effort is normal.     Breath sounds: Normal breath sounds.  Skin:    General: Skin is warm and dry.  Neurological:     General: No focal deficit present.     Mental Status: She is alert and oriented to person, place, and time.     Gait: Gait normal.  Psychiatric:        Attention and Perception: Attention and perception normal.        Mood and Affect: Mood and affect normal.        Behavior: Behavior normal. Behavior is cooperative.        Thought Content: Thought content normal.        Cognition and Memory: Cognition and memory normal.        Judgment: Judgment normal.     Assessment   1. Allergic rhinitis sxs with likely onset of URI  2. Hypothyroidism and addisons  3. +ANA  4. HM 5. Iron def anemia  6. Vit D def 18.89 01/24/18  Plan   1.  Trial of nasal saline otc  Taking otc allergy pill AH, rec add singulair 10 mg qhs to see if helps  Prn Robitussin DM for cough  2. F/u endocrine 08/2018  3. Pt never kept rheumatology referral will reschedule  4.  Reviewed labs  Cont healthy diet choices  Declines flu shot  tdaphad 09/01/15 (per pt had) OB/GYN records and proven had 09/21/11 Pap 02/07/18 neg pap except for BV +neg HPV mammo age 8 Disc my myriad, my risk and brca testing pt will disc with ob/GYN in future   5. rec iron 325 mg daily in mvt or separate dose with OJ better absorption 6.vitamin D50K weekly x 6 months then weekly 5000 IU starting 08/2018   Provider: Dr. Olivia Mackie McLean-Scocuzza-Internal Medicine

## 2018-06-20 NOTE — Progress Notes (Signed)
Pre visit review using our clinic review tool, if applicable. No additional management support is needed unless otherwise documented below in the visit note. 

## 2018-06-20 NOTE — Patient Instructions (Addendum)
Nasal saline you can use over the counter  Take singulair at night for allergies with over the counter allergy pill at night for allergies  -call me back if this is not helping  Check to see if your multivitamin has iron-ferrous sulfate 325 mg daily best if take with Orange juice daily   Get your house checked for mold   Montelukast oral tablets What is this medicine? MONTELUKAST (mon te LOO kast) is used to prevent and treat the symptoms of asthma. It is also used to treat allergies. Do not use for an acute asthma attack. This medicine may be used for other purposes; ask your health care provider or pharmacist if you have questions. COMMON BRAND NAME(S): Singulair What should I tell my health care provider before I take this medicine? They need to know if you have any of these conditions: -liver disease -an unusual or allergic reaction to montelukast, other medicines, foods, dyes, or preservatives -pregnant or trying to get pregnant -breast-feeding How should I use this medicine? This medicine should be given by mouth. Follow the directions on the prescription label. Take this medicine at the same time every day. You may take this medicine with or without meals. Do not chew the tablets. Do not stop taking your medicine unless your doctor tells you to. Talk to your pediatrician regarding the use of this medicine in children. Special care may be needed. While this drug may be prescribed for children as young as 815 years of age for selected conditions, precautions do apply. Overdosage: If you think you have taken too much of this medicine contact a poison control center or emergency room at once. NOTE: This medicine is only for you. Do not share this medicine with others. What if I miss a dose? If you miss a dose, take it as soon as you can. If it is almost time for your next dose, take only that dose. Do not take double or extra doses. What may interact with this medicine? -anti-infectives  like rifampin and rifabutin -medicines for seizures like phenytoin, phenobarbital, and carbamazepine This list may not describe all possible interactions. Give your health care provider a list of all the medicines, herbs, non-prescription drugs, or dietary supplements you use. Also tell them if you smoke, drink alcohol, or use illegal drugs. Some items may interact with your medicine. What should I watch for while using this medicine? Visit your doctor or health care professional for regular checks on your progress. Tell your doctor or health care professional if your allergy or asthma symptoms do not improve. Take your medicine even when you do not have symptoms. Do not stop taking any of your medicine(s) unless your doctor tells you to. If you have asthma, talk to your doctor about what to do in an acute asthma attack. Always have your inhaled rescue medicine for asthma attacks with you. Patients and their families should watch for new or worsening thoughts of suicide or depression. Also watch for sudden changes in feelings such as feeling anxious, agitated, panicky, irritable, hostile, aggressive, impulsive, severely restless, overly excited and hyperactive, or not being able to sleep. Any worsening of mood or thoughts of suicide or dying should be reported to your health care professional right away. What side effects may I notice from receiving this medicine? Side effects that you should report to your doctor or health care professional as soon as possible: -allergic reactions like skin rash or hives, or swelling of the face, lips, or tongue -breathing  problems -changes in emotions or moods -confusion -depressed mood -fever or infection -hallucinations -joint pain -painful lumps under the skin -pain, tingling, numbness in the hands or feet -redness, blistering, peeling, or loosening of the skin, including inside the mouth -restlessness -seizures -sleep walking -signs and symptoms of  infection like fever; chills; cough; sore throat; flu-like illness -signs and symptoms of liver injury like dark yellow or brown urine; general ill feeling or flu-like symptoms; light-colored stools; loss of appetite; nausea; right upper belly pain; unusually weak or tired; yellowing of the eyes or skin -sinus pain or swelling -stuttering -suicidal thoughts or other mood changes -tremors -trouble sleeping -uncontrolled muscle movements -unusual bleeding or bruising -vivid or bad dreams Side effects that usually do not require medical attention (report to your doctor or health care professional if they continue or are bothersome): -dizziness -drowsiness -headache -runny nose -stomach upset -tiredness This list may not describe all possible side effects. Call your doctor for medical advice about side effects. You may report side effects to FDA at 1-800-FDA-1088. Where should I keep my medicine? Keep out of the reach of children. Store at room temperature between 15 and 30 degrees C (59 and 86 degrees F). Protect from light and moisture. Keep this medicine in the original bottle. Throw away any unused medicine after the expiration date. NOTE: This sheet is a summary. It may not cover all possible information. If you have questions about this medicine, talk to your doctor, pharmacist, or health care provider.  2019 Elsevier/Gold Standard (2018-02-12 13:51:04)   Allergies, Adult An allergy is when your body's defense system (immune system) overreacts to an otherwise harmless substance (allergen) that you breathe in or eat or something that touches your skin. When you come into contact with something that you are allergic to, your immune system produces certain proteins (antibodies). These proteins cause cells to release chemicals (histamines) that trigger the symptoms of an allergic reaction. Allergies often affect the nasal passages (allergic rhinitis), eyes (allergic conjunctivitis), skin  (atopic dermatitis), and stomach. Allergies can be mild or severe. Allergies cannot spread from person to person (are not contagious). They can develop at any age and may be outgrown. What increases the risk? You may be at greater risk of allergies if other people in your family have allergies. What are the signs or symptoms? Symptoms depend on what type of allergy you have. They may include:  Runny, stuffy nose.  Sneezing.  Itchy mouth, ears, or throat.  Postnasal drip.  Sore throat.  Itchy, red, watery, or puffy eyes.  Skin rash or hives.  Stomach pain.  Vomiting.  Diarrhea.  Bloating.  Wheezing or coughing. People with a severe allergy to food, medicine, or an insect bite may have a life-threatening allergic reaction (anaphylaxis). Symptoms of anaphylaxis include:  Hives.  Itching.  Flushed face.  Swollen lips, tongue, or mouth.  Tight or swollen throat.  Chest pain or tightness in the chest.  Trouble breathing or shortness of breath.  Rapid heartbeat.  Dizziness or fainting.  Vomiting.  Diarrhea.  Pain in the abdomen. How is this diagnosed? This condition is diagnosed based on:  Your symptoms.  Your family and medical history.  A physical exam. You may need to see a health care provider who specializes in treating allergies (allergist). You may also have tests, including:  Skin tests to see which allergens are causing your symptoms, such as: ? Skin prick test. In this test, your skin is pricked with a tiny needle and  exposed to small amounts of possible allergens to see if your skin reacts. ? Intradermal skin test. In this test, a small amount of allergen is injected under your skin to see if your skin reacts. ? Patch test. In this test, a small amount of allergen is placed on your skin and then your skin is covered with a bandage. Your health care provider will check your skin after a couple of days to see if a rash has developed.  Blood  tests.  Challenges tests. In this test, you inhale a small amount of allergen by mouth to see if you have an allergic reaction. You may also be asked to:  Keep a food diary. A food diary is a record of all the foods and drinks you have in a day and any symptoms you experience.  Practice an elimination diet. An elimination diet involves eliminating specific foods from your diet and then adding them back in one by one to find out if a certain food causes an allergic reaction. How is this treated? Treatment for allergies depends on your symptoms. Treatment may include:  Cold compresses to soothe itching and swelling.  Eye drops.  Nasal sprays.  Using a saline spray or container (neti pot) to flush out the nose (nasal irrigation). These methods can help clear away mucus and keep the nasal passages moist.  Using a humidifier.  Oral antihistamines or other medicines to block allergic reaction and inflammation.  Skin creams to treat rashes or itching.  Diet changes to eliminate food allergy triggers.  Repeated exposure to tiny amounts of allergens to build up a tolerance and prevent future allergic reactions (immunotherapy). These include: ? Allergy shots. ? Oral treatment. This involves taking small doses of an allergen under the tongue (sublingual immunotherapy).  Emergency epinephrine injection (auto-injector) in case of an allergic emergency. This is a self-injectable, pre-measured medicine that must be given within the first few minutes of a serious allergic reaction. Follow these instructions at home:         Avoid known allergens whenever possible.  If you suffer from airborne allergens, wash out your nose daily. You can do this with a saline spray or a neti pot to flush out your nose (nasal irrigation).  Take over-the-counter and prescription medicines only as told by your health care provider.  Keep all follow-up visits as told by your health care provider. This is  important.  If you are at risk of a severe allergic reaction (anaphylaxis), keep your auto-injector with you at all times.  If you have ever had anaphylaxis, wear a medical alert bracelet or necklace that states you have a severe allergy. Contact a health care provider if:  Your symptoms do not improve with treatment. Get help right away if:  You have symptoms of anaphylaxis, such as: ? Swollen mouth, tongue, or throat. ? Pain or tightness in your chest. ? Trouble breathing or shortness of breath. ? Dizziness or fainting. ? Severe abdominal pain, vomiting, or diarrhea. This information is not intended to replace advice given to you by your health care provider. Make sure you discuss any questions you have with your health care provider. Document Released: 09/12/2002 Document Revised: 10/18/2016 Document Reviewed: 01/05/2016 Elsevier Interactive Patient Education  2019 [employer]

## 2018-06-21 NOTE — Telephone Encounter (Signed)
Left message for patient to return call back. PEC may give information.  

## 2018-07-09 ENCOUNTER — Encounter: Payer: Self-pay | Admitting: Internal Medicine

## 2018-07-23 ENCOUNTER — Encounter: Payer: Self-pay | Admitting: Internal Medicine

## 2018-08-20 ENCOUNTER — Ambulatory Visit: Payer: BC Managed Care – PPO

## 2018-09-01 ENCOUNTER — Telehealth: Payer: Self-pay | Admitting: Internal Medicine

## 2018-09-01 DIAGNOSIS — D649 Anemia, unspecified: Secondary | ICD-10-CM

## 2018-09-01 DIAGNOSIS — Z1159 Encounter for screening for other viral diseases: Secondary | ICD-10-CM

## 2018-09-01 DIAGNOSIS — Z0184 Encounter for antibody response examination: Secondary | ICD-10-CM

## 2018-09-01 DIAGNOSIS — E039 Hypothyroidism, unspecified: Secondary | ICD-10-CM

## 2018-09-01 DIAGNOSIS — E559 Vitamin D deficiency, unspecified: Secondary | ICD-10-CM

## 2018-09-01 DIAGNOSIS — Z0000 Encounter for general adult medical examination without abnormal findings: Secondary | ICD-10-CM

## 2018-09-01 DIAGNOSIS — Z111 Encounter for screening for respiratory tuberculosis: Secondary | ICD-10-CM

## 2018-09-01 NOTE — Telephone Encounter (Signed)
Call pt  For work form  1. Please sch labs nonfasting needs to get TB blood test, MMR test and will do other labs I.e vitmain D and iron does not have to be fasting  2. Also she needs to schedule 3rd old hep B vaccine has not had   Thanks tMS

## 2018-09-01 NOTE — Telephone Encounter (Signed)
See phone note   TMS 

## 2018-09-02 NOTE — Telephone Encounter (Signed)
Left message for patient to return call back. PEC may give and obtain information.  

## 2018-09-03 ENCOUNTER — Telehealth: Payer: Self-pay

## 2018-09-03 NOTE — Telephone Encounter (Signed)
error 

## 2018-09-03 NOTE — Telephone Encounter (Signed)
Left message for patient to return call back. PEC may give information.  Patient needs to schedule and labs to finish paperwork. Labs have been ordered.

## 2018-09-14 ENCOUNTER — Encounter: Payer: Self-pay | Admitting: Internal Medicine

## 2018-10-02 ENCOUNTER — Encounter: Payer: Self-pay | Admitting: Internal Medicine

## 2018-10-02 ENCOUNTER — Telehealth: Payer: Self-pay

## 2018-10-02 NOTE — Telephone Encounter (Signed)
Copied from CRM [id number]. Topic: General - Other >> Oct 02, 2018 10:08 AM Debroah Loop wrote: Reason for CRM: Needs documentation from [facility] for herself and her husband to explain why they bth need to work from home. Please call her back to discuss.

## 2018-10-25 ENCOUNTER — Encounter: Payer: Self-pay | Admitting: Internal Medicine

## 2018-11-26 ENCOUNTER — Encounter: Payer: Self-pay | Admitting: Internal Medicine

## 2018-11-27 ENCOUNTER — Encounter: Payer: Self-pay | Admitting: Internal Medicine

## 2018-12-20 ENCOUNTER — Ambulatory Visit: Payer: BC Managed Care – PPO | Admitting: Internal Medicine

## 2018-12-26 ENCOUNTER — Encounter: Payer: Self-pay | Admitting: Internal Medicine

## 2019-04-23 ENCOUNTER — Ambulatory Visit (INDEPENDENT_AMBULATORY_CARE_PROVIDER_SITE_OTHER): Payer: BC Managed Care – PPO

## 2019-04-23 ENCOUNTER — Other Ambulatory Visit: Payer: Self-pay

## 2019-04-23 DIAGNOSIS — Z23 Encounter for immunization: Secondary | ICD-10-CM

## 2019-08-06 ENCOUNTER — Encounter: Payer: Self-pay | Admitting: Internal Medicine

## 2019-08-06 ENCOUNTER — Other Ambulatory Visit: Payer: Self-pay | Admitting: Internal Medicine

## 2019-08-06 DIAGNOSIS — R430 Anosmia: Secondary | ICD-10-CM

## 2019-08-06 DIAGNOSIS — R481 Agnosia: Secondary | ICD-10-CM

## 2019-08-06 DIAGNOSIS — R768 Other specified abnormal immunological findings in serum: Secondary | ICD-10-CM

## 2019-08-31 ENCOUNTER — Ambulatory Visit: Payer: BC Managed Care – PPO | Attending: Internal Medicine

## 2019-08-31 DIAGNOSIS — Z23 Encounter for immunization: Secondary | ICD-10-CM | POA: Insufficient documentation

## 2019-08-31 NOTE — Progress Notes (Signed)
   Covid-19 Vaccination Clinic  Name:  Heidi Adkins    MRN: [id number] DOB: 1980/08/26  08/31/2019  [name] was observed post Covid-19 immunization for 15 minutes without incidence. She was provided with Vaccine Information Sheet and instruction to access the V-Safe system.   [name] was instructed to call 911 with any severe reactions post vaccine: Marland Kitchen Difficulty breathing  . Swelling of your face and throat  . A fast heartbeat  . A bad rash all over your body  . Dizziness and weakness    Immunizations Administered    Name Date Dose VIS Date Route   Pfizer COVID-19 Vaccine 08/31/2019 12:50 PM 0.3 mL 06/13/2019 Intramuscular   Manufacturer: [employer], [employer]   Lot: [id number]   NDC: [address]-8

## 2019-09-08 ENCOUNTER — Telehealth: Payer: Self-pay | Admitting: Internal Medicine

## 2019-09-08 NOTE — Telephone Encounter (Signed)
Received last office note form [facility] and placed on your desk.

## 2019-09-13 ENCOUNTER — Encounter: Payer: Self-pay | Admitting: Internal Medicine

## 2019-09-23 ENCOUNTER — Ambulatory Visit: Payer: BC Managed Care – PPO | Attending: Internal Medicine

## 2019-09-23 DIAGNOSIS — Z23 Encounter for immunization: Secondary | ICD-10-CM

## 2019-09-23 NOTE — Progress Notes (Signed)
   Covid-19 Vaccination Clinic  Name:  Heidi Adkins    MRN: [id number] DOB: 1981/01/10  09/23/2019  [name] was observed post Covid-19 immunization for 15 minutes without incident. She was provided with Vaccine Information Sheet and instruction to access the V-Safe system.   [name] was instructed to call 911 with any severe reactions post vaccine: Marland Kitchen Difficulty breathing  . Swelling of face and throat  . A fast heartbeat  . A bad rash all over body  . Dizziness and weakness   Immunizations Administered    Name Date Dose VIS Date Route   Pfizer COVID-19 Vaccine 09/23/2019  9:35 AM 0.3 mL 06/13/2019 Intramuscular   Manufacturer: [employer], [employer]   Lot: [id number]   NDC: [address]-1

## 2019-12-23 ENCOUNTER — Encounter: Payer: Self-pay | Admitting: Internal Medicine

## 2020-02-02 ENCOUNTER — Telehealth: Payer: Self-pay

## 2020-02-02 NOTE — Telephone Encounter (Signed)
Pt called for a replacement vaccination card. Informed pt that NT will send e-mail to [employer]. Verified demographic information.  Email with name, MRN, DOB, address and phone number sent to [facility].

## 2020-11-18 ENCOUNTER — Encounter: Payer: Self-pay | Admitting: Internal Medicine

## 2021-02-07 ENCOUNTER — Other Ambulatory Visit: Payer: Self-pay | Admitting: Internal Medicine

## 2021-02-07 DIAGNOSIS — Z1231 Encounter for screening mammogram for malignant neoplasm of breast: Secondary | ICD-10-CM

## 2021-02-08 ENCOUNTER — Ambulatory Visit: Payer: 59 | Admitting: Nurse Practitioner

## 2021-02-09 ENCOUNTER — Other Ambulatory Visit: Payer: Self-pay

## 2021-02-09 ENCOUNTER — Ambulatory Visit
Admission: RE | Admit: 2021-02-09 | Discharge: 2021-02-09 | Disposition: A | Discharge status: Home / Self Care | Payer: 59 | Source: Ambulatory Visit

## 2021-02-09 DIAGNOSIS — Z1231 Encounter for screening mammogram for malignant neoplasm of breast: Secondary | ICD-10-CM

## 2021-06-23 ENCOUNTER — Encounter: Payer: Self-pay | Admitting: Internal Medicine

## 2021-06-23 NOTE — Telephone Encounter (Signed)
Please advise does Patient need an appointment or can something over the counter work for her?   Acute visits available tomorrow.

## 2021-08-31 ENCOUNTER — Encounter: Payer: 59 | Admitting: Internal Medicine

## 2021-09-07 ENCOUNTER — Ambulatory Visit (INDEPENDENT_AMBULATORY_CARE_PROVIDER_SITE_OTHER): Payer: BC Managed Care – PPO | Admitting: Internal Medicine

## 2021-09-07 ENCOUNTER — Encounter: Payer: Self-pay | Admitting: Internal Medicine

## 2021-09-07 ENCOUNTER — Other Ambulatory Visit (HOSPITAL_COMMUNITY)
Admission: RE | Admit: 2021-09-07 | Discharge: 2021-09-07 | Disposition: A | Discharge status: Home / Self Care | Payer: BC Managed Care – PPO | Source: Ambulatory Visit | Attending: Internal Medicine | Admitting: Internal Medicine

## 2021-09-07 ENCOUNTER — Other Ambulatory Visit: Payer: Self-pay | Admitting: Internal Medicine

## 2021-09-07 ENCOUNTER — Other Ambulatory Visit: Payer: Self-pay

## 2021-09-07 VITALS — BP 100/70 | HR 92 | Temp 98.2°F | Ht 68.27 in | Wt 183.6 lb

## 2021-09-07 DIAGNOSIS — E611 Iron deficiency: Secondary | ICD-10-CM | POA: Insufficient documentation

## 2021-09-07 DIAGNOSIS — N841 Polyp of cervix uteri: Secondary | ICD-10-CM

## 2021-09-07 DIAGNOSIS — Z124 Encounter for screening for malignant neoplasm of cervix: Secondary | ICD-10-CM

## 2021-09-07 DIAGNOSIS — N760 Acute vaginitis: Secondary | ICD-10-CM | POA: Diagnosis present

## 2021-09-07 DIAGNOSIS — Z1231 Encounter for screening mammogram for malignant neoplasm of breast: Secondary | ICD-10-CM | POA: Diagnosis not present

## 2021-09-07 DIAGNOSIS — N926 Irregular menstruation, unspecified: Secondary | ICD-10-CM | POA: Diagnosis not present

## 2021-09-07 DIAGNOSIS — Z13818 Encounter for screening for other digestive system disorders: Secondary | ICD-10-CM

## 2021-09-07 DIAGNOSIS — E559 Vitamin D deficiency, unspecified: Secondary | ICD-10-CM

## 2021-09-07 DIAGNOSIS — B9689 Other specified bacterial agents as the cause of diseases classified elsewhere: Secondary | ICD-10-CM | POA: Diagnosis not present

## 2021-09-07 DIAGNOSIS — Z0000 Encounter for general adult medical examination without abnormal findings: Secondary | ICD-10-CM

## 2021-09-07 DIAGNOSIS — E538 Deficiency of other specified B group vitamins: Secondary | ICD-10-CM | POA: Diagnosis not present

## 2021-09-07 LAB — IBC + FERRITIN
Ferritin: 6.7 ng/mL — ABNORMAL LOW (ref 10.0–291.0)
Iron: 41 ug/dL — ABNORMAL LOW (ref 42–145)
Saturation Ratios: 11.7 % — ABNORMAL LOW (ref 20.0–50.0)
TIBC: 350 ug/dL (ref 250.0–450.0)
Transferrin: 250 mg/dL (ref 212.0–360.0)

## 2021-09-07 LAB — VITAMIN B12: Vitamin B-12: 217 pg/mL (ref 211–911)

## 2021-09-07 LAB — CBC WITH DIFFERENTIAL/PLATELET
Basophils Absolute: 0 10*3/uL (ref 0.0–0.1)
Basophils Relative: 0.6 % (ref 0.0–3.0)
Eosinophils Absolute: 0.5 10*3/uL (ref 0.0–0.7)
Eosinophils Relative: 7.7 % — ABNORMAL HIGH (ref 0.0–5.0)
HCT: 35.1 % — ABNORMAL LOW (ref 36.0–46.0)
Hemoglobin: 10.9 g/dL — ABNORMAL LOW (ref 12.0–15.0)
Lymphocytes Relative: 22.6 % (ref 12.0–46.0)
Lymphs Abs: 1.3 10*3/uL (ref 0.7–4.0)
MCHC: 31.2 g/dL (ref 30.0–36.0)
MCV: 75.9 fl — ABNORMAL LOW (ref 78.0–100.0)
Monocytes Absolute: 0.5 10*3/uL (ref 0.1–1.0)
Monocytes Relative: 7.6 % (ref 3.0–12.0)
Neutro Abs: 3.6 10*3/uL (ref 1.4–7.7)
Neutrophils Relative %: 61.5 % (ref 43.0–77.0)
Platelets: 187 10*3/uL (ref 150.0–400.0)
RBC: 4.62 Mil/uL (ref 3.87–5.11)
RDW: 15.6 % — ABNORMAL HIGH (ref 11.5–15.5)
WBC: 5.9 10*3/uL (ref 4.0–10.5)

## 2021-09-07 LAB — COMPREHENSIVE METABOLIC PANEL
ALT: 9 U/L (ref 0–35)
AST: 15 U/L (ref 0–37)
Albumin: 3.9 g/dL (ref 3.5–5.2)
Alkaline Phosphatase: 34 U/L — ABNORMAL LOW (ref 39–117)
BUN: 7 mg/dL (ref 6–23)
CO2: 27 mEq/L (ref 19–32)
Calcium: 8.4 mg/dL (ref 8.4–10.5)
Chloride: 106 mEq/L (ref 96–112)
Creatinine, Ser: 0.61 mg/dL (ref 0.40–1.20)
GFR: 111.25 mL/min (ref >60.00)
Glucose, Bld: 77 mg/dL (ref 70–99)
Potassium: 3.7 mEq/L (ref 3.5–5.1)
Sodium: 139 mEq/L (ref 135–145)
Total Bilirubin: 0.5 mg/dL (ref 0.2–1.2)
Total Protein: 6.7 g/dL (ref 6.0–8.3)

## 2021-09-07 LAB — LIPID PANEL
Cholesterol: 155 mg/dL (ref 0–200)
HDL: 60.5 mg/dL (ref >39.00)
LDL Cholesterol: 72 mg/dL (ref 0–99)
NonHDL: 94.01
Total CHOL/HDL Ratio: 3
Triglycerides: 112 mg/dL (ref 0.0–149.0)
VLDL: 22.4 mg/dL (ref 0.0–40.0)

## 2021-09-07 LAB — FOLLICLE STIMULATING HORMONE: FSH: 1.9 m[IU]/mL

## 2021-09-07 LAB — VITAMIN D 25 HYDROXY (VIT D DEFICIENCY, FRACTURES): VITD: 20.95 ng/mL — ABNORMAL LOW (ref 30.00–100.00)

## 2021-09-07 LAB — TSH: TSH: 1.35 u[IU]/mL (ref 0.35–5.50)

## 2021-09-07 MED ORDER — CHOLECALCIFEROL 1.25 MG (50000 UT) PO CAPS: 50000.0000 [IU] | ORAL_CAPSULE | ORAL | 1 refills | Status: DC

## 2021-09-07 NOTE — Progress Notes (Signed)
Chief Complaint  ?Patient presents with  ? Annual Exam  ? Gynecologic Exam  ? ?Annual  ?1. C/o irregular cycle or bleeding 2x per month since 04/2021 FH cervical/ovarian cancer will refer Dr. Garwin Brothers mom and m GM early menpause ? ? ?Review of Systems  ?Constitutional:  Negative for weight loss.  ?HENT:  Negative for hearing loss.   ?Eyes:  Negative for blurred vision.  ?Respiratory:  Negative for shortness of breath.   ?Cardiovascular:  Negative for chest pain.  ?Gastrointestinal:  Negative for abdominal pain and blood in stool.  ?Genitourinary:  Negative for dysuria.  ?Musculoskeletal:  Negative for falls and joint pain.  ?Skin:  Negative for rash.  ?Neurological:  Negative for headaches.  ?Psychiatric/Behavioral:  Negative for depression.   ?Past Medical History:  ?Diagnosis Date  ? Addison's disease ([place])   ? Allergy   ? Anemia   ? COVID-19   ? 4 or 10/2020  ? Hx of varicella   ? Hypothyroidism   ? hypothyroidism   ? Postpartum care following repeat cesarean delivery with bilateral salpingectomy (5/6) 11/06/2015  ? Status post bilateral salpingectomy 11/06/2015  ? Vaginal Pap smear, abnormal   ? ?Past Surgical History:  ?Procedure Laterality Date  ? CESAREAN SECTION    ? CESAREAN SECTION  11/08/2011  ? Procedure: CESAREAN SECTION;  Surgeon: Marvene Staff, MD;  Location: [facility];  Service: Gynecology;  Laterality: N/A;  ? CESAREAN SECTION N/A 11/06/2015  ? Procedure: Repeat CESAREAN SECTION;  Surgeon: Servando Salina, MD;  Location: [place];  Service: Obstetrics;  Laterality: N/A;  EDD: 11/12/15  ? OTHER SURGICAL HISTORY    ? fallopian tube removal b/l   ? TUBAL LIGATION Bilateral 11/06/2015  ? Procedure: BILATERAL TUBAL LIGATION;  Surgeon: Servando Salina, MD;  Location: [place];  Service: Obstetrics;  Laterality: Bilateral;  ? ?Family History  ?Problem Relation Age of Onset  ? Miscarriages / [place] Mother   ? Fibromyalgia Mother   ? Diabetes Father   ? Urolithiasis Father   ?  Hypertension Father   ? Arthritis Father   ? Asthma Father   ? Hyperlipidemia Father   ? Cancer Sister   ?     cervical vs ovarian ? died age 70 pt stated ovarian   ? Depression Maternal Grandmother   ? Hypertension Maternal Grandmother   ? Deep vein thrombosis Maternal Grandmother   ? Diabetes Maternal Grandmother   ? Heart disease Maternal Grandmother   ? Hearing loss Maternal Grandmother   ? Cancer Maternal Grandfather   ?     colon  ? Asthma Paternal Grandmother   ? Breast cancer Paternal Grandmother   ? Depression Paternal Grandmother   ? Diabetes Paternal Grandmother   ? Cancer Paternal Grandmother   ?     breast   ? Diabetes Paternal Grandfather   ? Asthma Son   ? Cancer Other   ?     p. great aunts cancer x 2   ? ?Social History  ? ?Socioeconomic History  ? Marital status: Married  ?  Spouse name: Not on file  ? Number of children: Not on file  ? Years of education: Not on file  ? Highest education level: Not on file  ?Occupational History  ? Not on file  ?Tobacco Use  ? Smoking status: Never  ? Smokeless tobacco: Never  ?Substance and Sexual Activity  ? Alcohol use: No  ? Drug use: No  ? Sexual activity: Yes  ?Other  Topics Concern  ? Not on file  ?Social History Narrative  ? Married 3 kids   ? 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 Owns guns   ? Wears seat belts   ? Safe in relationship   ? Wears glasses [facility]   ? ?Social Determinants of Health  ? ?Financial Resource Strain: Not on file  ?Food Insecurity: Not on file  ?Transportation Needs: Not on file  ?Physical Activity: Not on file  ?Stress: Not on file  ?Social Connections: Not on file  ?Intimate Partner Violence: Not on file  ? ?Current Meds  ?Medication Sig  ? cetirizine (ZYRTEC) 10 MG tablet Take by mouth as needed.  ? hydrocortisone (CORTEF) 10 MG tablet Take 10 mg by mouth daily.   ? hydrocortisone 2.5 % ointment Apply topically 2 (two) times daily. Bid prn  ? levothyroxine (SYNTHROID, LEVOTHROID) 75 MCG  tablet Take 75 mcg by mouth daily.  ? ?No Known Allergies ?No results found for this or any previous visit (from the past 2160 hour(s)). ?Objective  ?Body mass index is 27.7 kg/m?. ?Wt Readings from Last 3 Encounters:  ?09/07/21 183 lb 9.6 oz (83.3 kg)  ?06/20/18 176 lb 12.8 oz (80.2 kg)  ?02/07/18 169 lb 6.4 oz (76.8 kg)  ? ?Temp Readings from Last 3 Encounters:  ?09/07/21 98.2 ?F (36.8 ?C) (Oral)  ?06/20/18 98.1 ?F (36.7 ?C) (Oral)  ?02/07/18 98.3 ?F (36.8 ?C) (Oral)  ? ?BP Readings from Last 3 Encounters:  ?09/07/21 100/70  ?06/20/18 106/64  ?02/07/18 (!) 98/56  ? ?Pulse Readings from Last 3 Encounters:  ?09/07/21 92  ?06/20/18 84  ?02/07/18 77  ? ? ?Physical Exam ?Vitals and nursing note reviewed.  ?Constitutional:   ?   Appearance: Normal appearance. She is well-developed and well-groomed.  ?HENT:  ?   Head: Normocephalic and atraumatic.  ?Eyes:  ?   Conjunctiva/sclera: Conjunctivae normal.  ?   Pupils: Pupils are equal, round, and reactive to light.  ?Cardiovascular:  ?   Rate and Rhythm: Normal rate and regular rhythm.  ?   Heart sounds: Normal heart sounds. No murmur heard. ?Pulmonary:  ?   Effort: Pulmonary effort is normal.  ?   Breath sounds: Normal breath sounds.  ?Abdominal:  ?   General: Abdomen is flat. Bowel sounds are normal.  ?   Tenderness: There is no abdominal tenderness.  ?Genitourinary: ?   General: Normal vulva.  ?   Exam position: Supine.  ?   Pubic Area: No rash.   ?   Labia:     ?   Right: No rash.     ?   Left: No rash.   ?   Vagina: Normal.  ?   Cervix: Discharge and lesion present.  ?   Uterus: Absent.   ?   Adnexa: Right adnexa normal and left adnexa normal.  ?   Comments: +cervical polyp ?Musculoskeletal:     ?   General: No tenderness.  ?Skin: ?   General: Skin is warm and dry.  ?Neurological:  ?   General: No focal deficit present.  ?   Mental Status: She is alert and oriented to person, place, and time. Mental status is at baseline.  ?   Cranial Nerves: Cranial nerves 2-12 are  intact.  ?   Motor: Motor function is intact.  ?   Coordination: Coordination is intact.  ?   Gait: Gait is intact.  ?Psychiatric:     ?  Attention and Perception: Attention and perception normal.     ?   Mood and Affect: Mood and affect normal.     ?   Speech: Speech normal.     ?   Behavior: Behavior normal. Behavior is cooperative.     ?   Thought Content: Thought content normal.     ?   Cognition and Memory: Cognition and memory normal.     ?   Judgment: Judgment normal.  ? ? ?Assessment  ?Plan  ?Annual physical exam - Plan: Comprehensive metabolic panel, Lipid panel, CBC with Differential/Platelet, TSH, Urinalysis, Routine w reflex microscopic, Vitamin D (25 hydroxy), FSH ?See below ? ?Routine cervical smear - Plan: Cytology - PAP( [place]) ? ?Irregular menstrual cycle with cervical polyp- Plan: [facility], Ambulatory referral to Obstetrics / Gynecology, ?Cervical polyp - Plan: Ambulatory referral to Obstetrics / Gynecology  ? ?HM ?Declines flu shot  ?tdap had 09/01/15 (per pt had) OB/GYN records and proven had 09/21/11  ?Pap 02/07/18 neg pap except for BV +neg HPV ?Pap today  ? ?mammo yearly due 01/2022  ? ?Disc my myriad, my risk and brca testing pt will disc with ob/GYN in future  ? ?Colonoscopy age 52  ? ?Rec healthy diet and exercise  ?Provider: Dr. Olivia Mackie McLean-Scocuzza-Internal Medicine  ?

## 2021-09-08 ENCOUNTER — Other Ambulatory Visit: Payer: Self-pay | Admitting: Internal Medicine

## 2021-09-08 DIAGNOSIS — B9689 Other specified bacterial agents as the cause of diseases classified elsewhere: Secondary | ICD-10-CM

## 2021-09-08 LAB — URINALYSIS, ROUTINE W REFLEX MICROSCOPIC
Bilirubin Urine: NEGATIVE
Glucose, UA: NEGATIVE
Hgb urine dipstick: NEGATIVE
Ketones, ur: NEGATIVE
Leukocytes,Ua: NEGATIVE
Nitrite: NEGATIVE
Protein, ur: NEGATIVE
Specific Gravity, Urine: 1.022 (ref 1.001–1.035)
pH: 8.5 — ABNORMAL HIGH (ref 5.0–8.0)

## 2021-09-08 LAB — CYTOLOGY - PAP
Comment: NEGATIVE
Diagnosis: NEGATIVE
High risk HPV: NEGATIVE

## 2021-09-08 LAB — CERVICOVAGINAL ANCILLARY ONLY
Bacterial Vaginitis (gardnerella): POSITIVE — AB
Candida Glabrata: NEGATIVE
Candida Vaginitis: NEGATIVE
Comment: NEGATIVE
Comment: NEGATIVE
Comment: NEGATIVE

## 2021-09-08 LAB — HEPATITIS C ANTIBODY
Hepatitis C Ab: NONREACTIVE
SIGNAL TO CUT-OFF: 0.14 (ref <1.00)

## 2021-09-08 MED ORDER — METRONIDAZOLE 500 MG PO TABS: 500.0000 mg | ORAL_TABLET | Freq: Two times a day (BID) | ORAL | 0 refills | Status: DC

## 2021-09-11 ENCOUNTER — Encounter: Payer: Self-pay | Admitting: Internal Medicine

## 2021-09-13 NOTE — Telephone Encounter (Signed)
Please advise on referral

## 2021-09-14 ENCOUNTER — Encounter: Payer: Self-pay | Admitting: Internal Medicine

## 2021-09-14 NOTE — Telephone Encounter (Signed)
Pt called in stating that Dr. French Ana referred her to Dr. Cherly Hensen office. Pt stated that there office never received anything. Pt stated there fax # [phone]. Pt requesting callback  ?

## 2021-09-15 NOTE — Telephone Encounter (Signed)
Patient calling back in on referral to OBGYN  ?

## 2021-09-15 NOTE — Telephone Encounter (Signed)
Noted  

## 2021-09-15 NOTE — Telephone Encounter (Signed)
I received a call from [facility] referral was received. Pt was informed. ?

## 2022-02-01 ENCOUNTER — Telehealth: Payer: Self-pay

## 2022-02-01 NOTE — Telephone Encounter (Signed)
Patient states she believes she is breaking out in hives.  Patient states it started Saturday (01/28/2022), while she was out of town.  Patient states she got back in town last night.  Patient states she has been ok today, but since she got home, she has noticed that she is itching more and it seems to be spreading.  Patient states she took a benadryl.  I transferred patient's call to Access Nurse.

## 2022-02-02 ENCOUNTER — Telehealth: Payer: Self-pay

## 2022-02-02 NOTE — Telephone Encounter (Signed)
Can sch appt if needed if not better   Can try zyrtec/allegra/xyzal otc and pepcid 20 mg as needed to help   If comes back needs to see allergist

## 2022-02-09 ENCOUNTER — Encounter: Payer: Self-pay | Admitting: Internal Medicine

## 2022-02-10 ENCOUNTER — Ambulatory Visit
Admission: RE | Admit: 2022-02-10 | Discharge: 2022-02-10 | Disposition: A | Discharge status: Home / Self Care | Payer: BC Managed Care – PPO | Source: Ambulatory Visit | Attending: Internal Medicine | Admitting: Internal Medicine

## 2022-02-10 DIAGNOSIS — Z1231 Encounter for screening mammogram for malignant neoplasm of breast: Secondary | ICD-10-CM

## 2022-09-12 ENCOUNTER — Encounter: Payer: BC Managed Care – PPO | Admitting: Internal Medicine

## 2023-03-06 IMAGING — MG MM DIGITAL SCREENING BILAT W/ TOMO AND CAD
6 of 10 series · 6 of 30 positions shown · non-contrast
Comparison: None.

CLINICAL DATA: Screening.

EXAM:
DIGITAL SCREENING BILATERAL MAMMOGRAM WITH TOMOSYNTHESIS AND CAD
TECHNIQUE: Bilateral screening digital craniocaudal and mediolateral oblique
mammograms were obtained. Bilateral screening digital breast
tomosynthesis was performed. The images were evaluated with
computer-aided detection.

[R MLO synth-2D (1 of 2)]
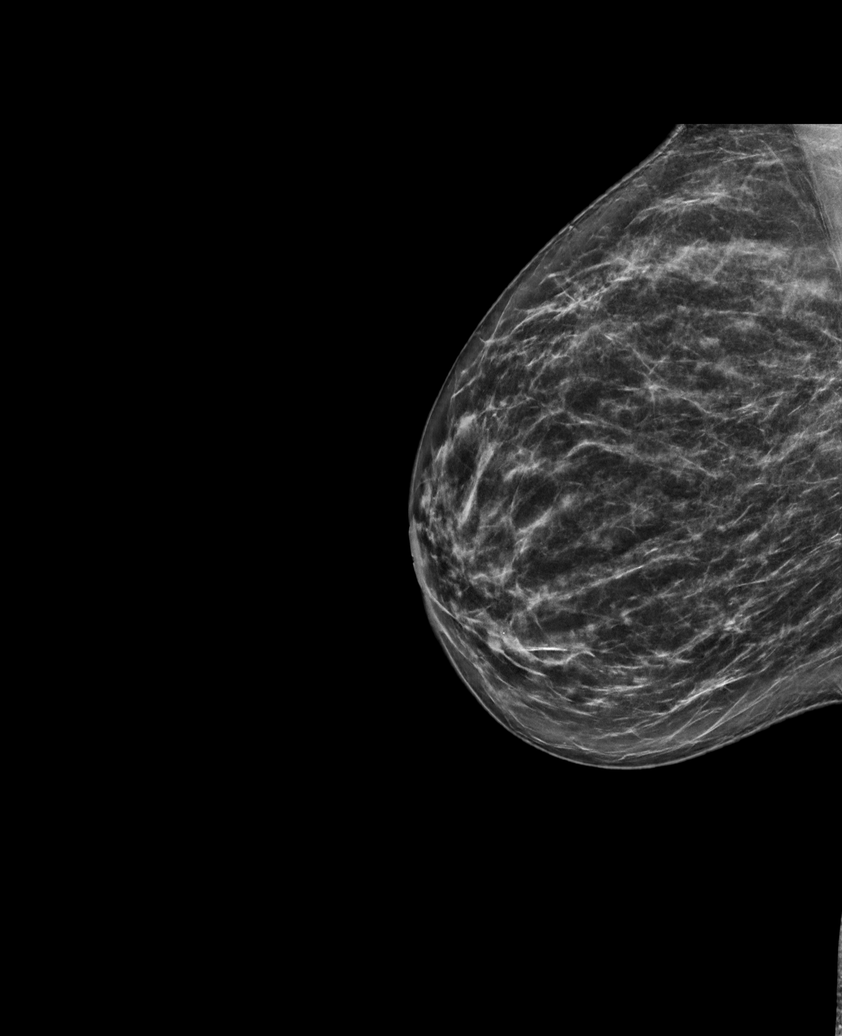

[R CC synth-2D]
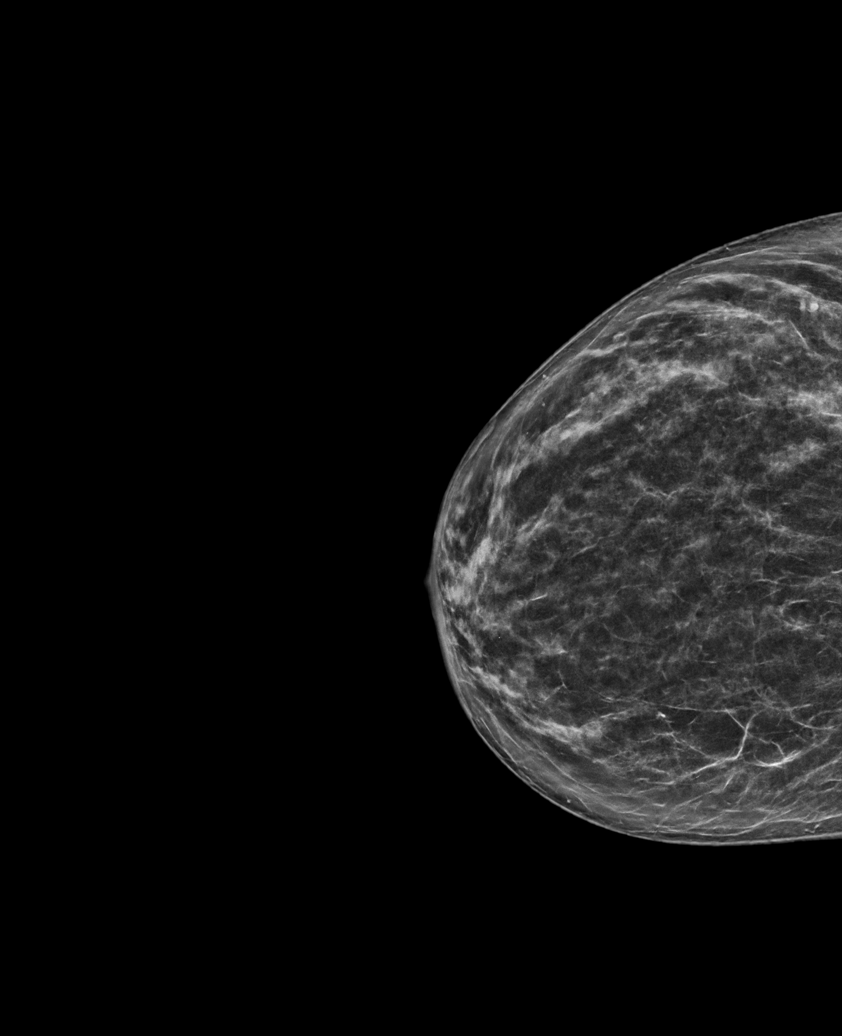

[R MLO synth-2D (2 of 2)]
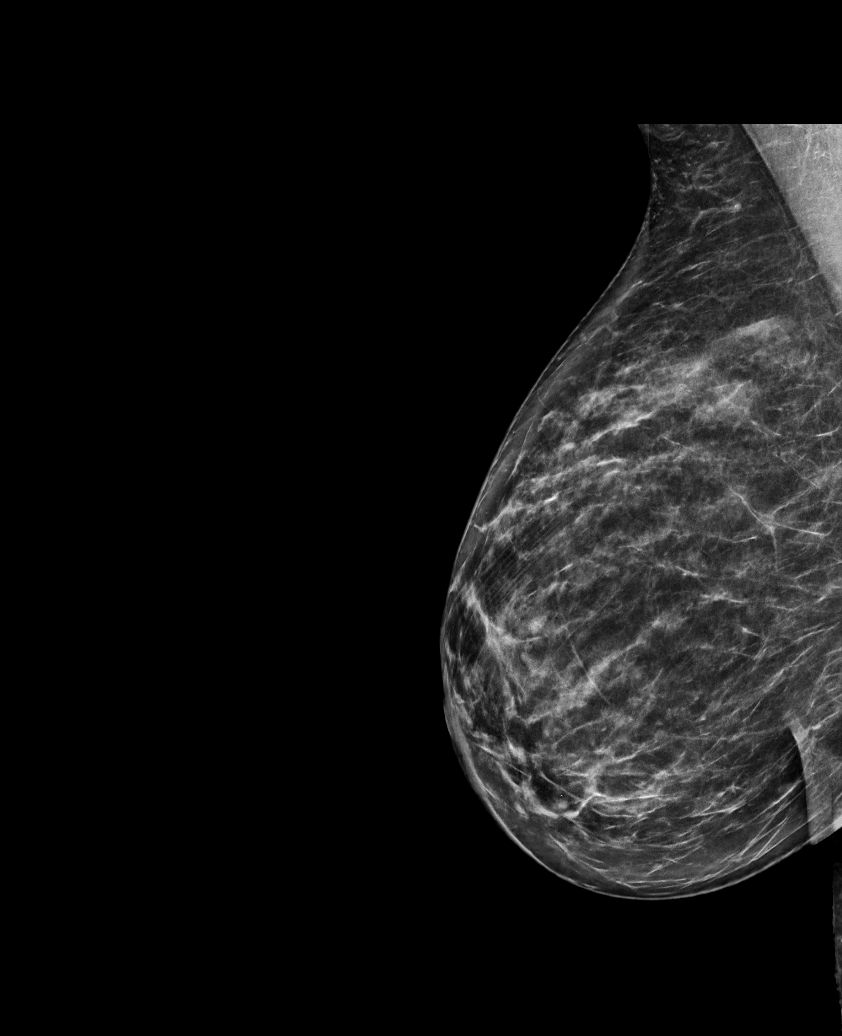

[L MLO synth-2D]
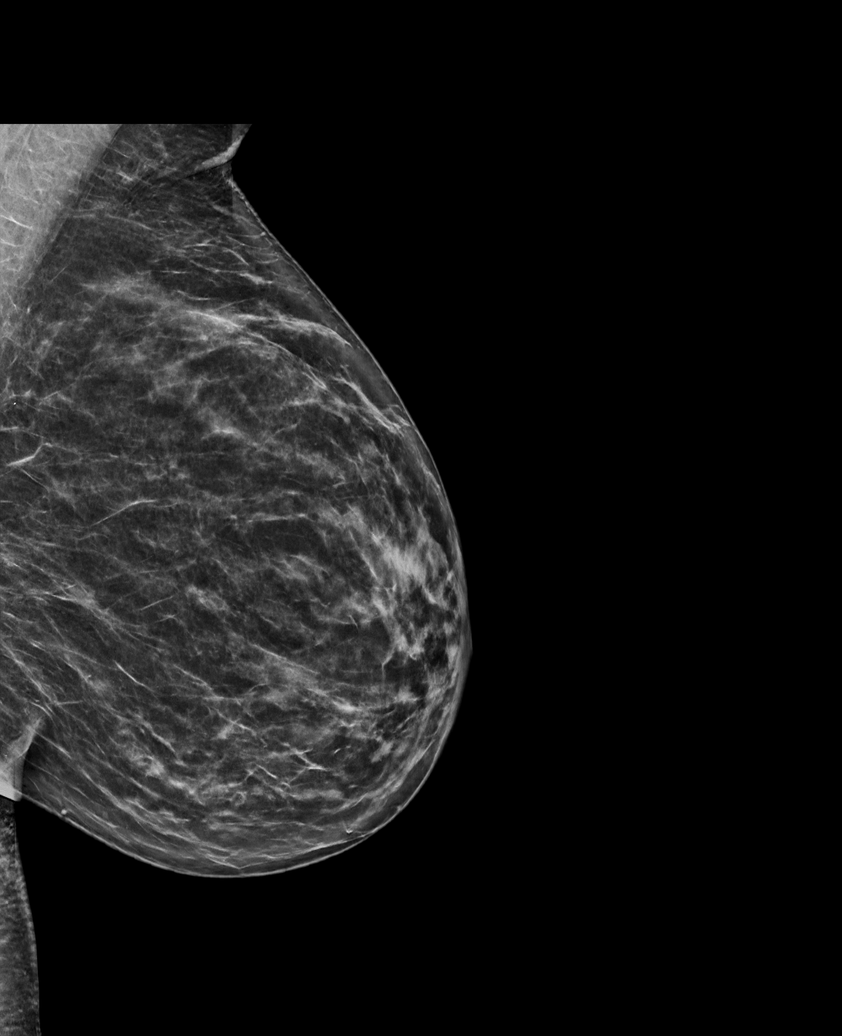

[L CC synth-2D]
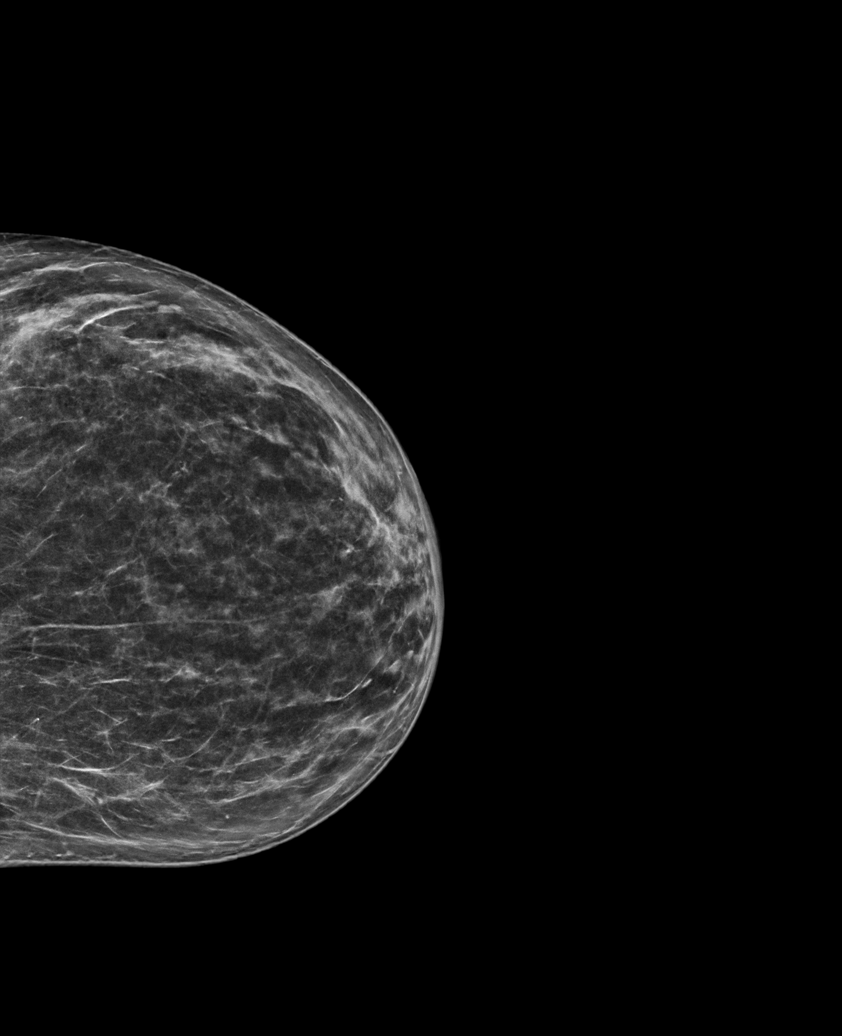

[R MLO tomo · tomo slice 42/83.0]
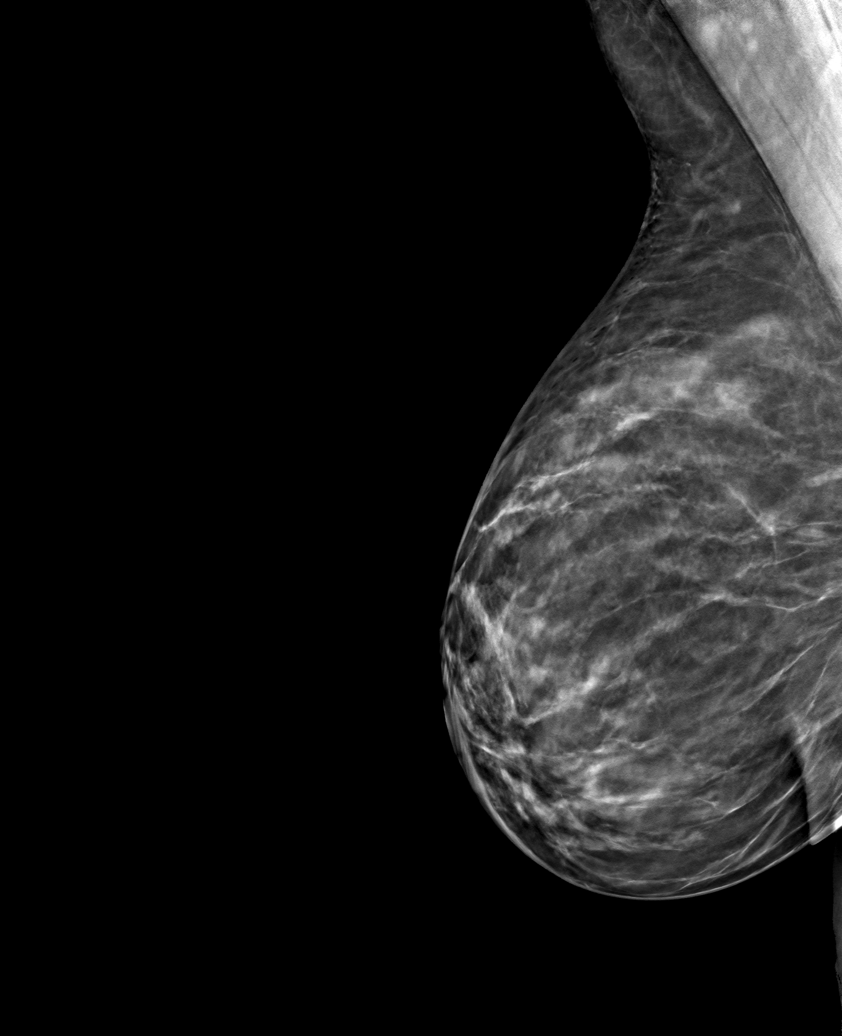

[6 of 30 positions shown; findings below may reference images not displayed]

ACR Breast Density Category b: There are scattered areas of
fibroglandular density.
FINDINGS: There are no findings suspicious for malignancy.
IMPRESSION: No mammographic evidence of malignancy. A result letter of this
screening mammogram will be mailed directly to the patient.

RECOMMENDATION:
Screening mammogram in one year. (Code:XG-X-X7B)

BI-RADS CATEGORY  1: Negative.

## 2023-04-10 ENCOUNTER — Encounter: Payer: Self-pay | Admitting: Dermatology

## 2023-04-10 ENCOUNTER — Ambulatory Visit: Payer: BC Managed Care – PPO | Admitting: Dermatology

## 2023-04-10 VITALS — BP 100/66

## 2023-04-10 DIAGNOSIS — L818 Other specified disorders of pigmentation: Secondary | ICD-10-CM | POA: Diagnosis not present

## 2023-04-10 DIAGNOSIS — L209 Atopic dermatitis, unspecified: Secondary | ICD-10-CM | POA: Diagnosis not present

## 2023-04-10 DIAGNOSIS — L309 Dermatitis, unspecified: Secondary | ICD-10-CM

## 2023-04-10 DIAGNOSIS — L819 Disorder of pigmentation, unspecified: Secondary | ICD-10-CM

## 2023-04-10 MED ORDER — TACROLIMUS 0.1 % EX OINT: TOPICAL_OINTMENT | Freq: Two times a day (BID) | CUTANEOUS | 2 refills | Status: DC

## 2023-04-10 NOTE — Progress Notes (Signed)
   New Patient Visit   Subjective  LELAINA Adkins is a 42 y.o. female who presents for the following: She has had eczema of her face and neck since 1994. She is only using vaseline right now. Sh has had 2.5% hydrocortisone in the past. She is normally triggered by stress and dry, cold weather. She washes with only water - soaps tend to irritate her skin.Sometimes she uses alcohol then applies vaseline.    The following portions of the chart were reviewed this encounter and updated as appropriate: medications, allergies, medical history  Review of Systems:  No other skin or systemic complaints except as noted in HPI or Assessment and Plan.  Objective  Well appearing patient in no apparent distress; mood and affect are within normal limits.  A focused examination was performed of the following areas:   Relevant exam findings are noted in the Assessment and Plan.    Assessment & Plan   Eczema/Pityriasis Alba Exam:        1. Eczema (Atopic Dermatitis) - Assessment: Ongoing management of eczema with Vaseline for symptomatic relief of itch and rash. - Plan: Prescribe tacrolimus ointment for daily use to address inflammation without the side effects associated with steroids. If tacrolimus is not covered by insurance, consider pimecrolimus cream as an alternative. Schedule a follow-up appointment in three months to evaluate treatment efficacy. Instruct patient to report any adverse reactions to tacrolimus via MyChart messaging.  -Recommend VaniCream products for cleansing and moisturizing, highlighting their lack of preservatives, perfumes, formaldehyde releasers, parabens, dyes, and fragrances. Options include bar soaps, liquid soaps, moisturizers, heavy creams, lighter lotions, and an ointment less occlusive than pure Vaseline to mitigate the risk of clogged pores. Advise against the use of alcohol on the skin due to its drying effects.  2. Post-inflammatory Hypopigmentation -  Assessment: Condition noted, requiring monitoring for any changes or worsening. - Plan: Monitor the condition during follow-up appointments. Encourage the use of gentle skincare products to minimize further irritation and inflammation.    Return in about 3 months (around 07/11/2023).  I, Joanie Coddington, CMA, am acting as scribe for [employer], DO.     Documentation: I have reviewed the above documentation for accuracy and completeness, and I agree with the above.  Langston Reusing, DO

## 2023-04-10 NOTE — Patient Instructions (Addendum)
Hello Heidi Adkins,  Thank you for visiting [place] today. We appreciate your commitment to managing your eczema. Here is a summary of the key instructions from today's consultation:  - Medication: Start using Tacrolimus ointment daily.   - Application: Apply a thin layer to the affected areas.   - Alternative: If Tacrolimus is not covered by your insurance, consider Pimecrolimus cream as an alternative.  - Skincare Products: Use Vanny Cream products, which are free from:   - Preservatives   - Perfumes   - Formaldehyde releasers   - Parabens   - Dyes   - Fragrances   - Products Include: Bar soaps, liquid soaps, moisturizers, heavy creams, lighter lotions, and an ointment.  - General Care: Continue washing your face with water only to avoid irritation. Avoid alcohol-based products as they dry out the skin.  - Follow-Up: We will touch base in three months to assess your progress. If you experience any adverse reactions to the new medication, please send me a message through MyChart.  - Prescription: Your prescription, including a refill, has been sent to your pharmacy. Your after-visit summary will include pictures of the recommended VaniCream products for your reference.  We look forward to monitoring your progress and are here to support you in your journey. If you have any questions or concerns before our next meeting, please do not hesitate to reach out.  Warm regards,  Dr. Langston Reusing,  Dermatology              Important Information  Due to recent changes in healthcare laws, you may see results of your pathology and/or laboratory studies on MyChart before the doctors have had a chance to review them. We understand that in some cases there may be results that are confusing or concerning to you. Please understand that not all results are received at the same time and often the doctors may need to interpret multiple results in order to provide you with the best plan of care or  course of treatment. Therefore, we ask that you please give [place] 2 business days to thoroughly review all your results before contacting the office for clarification. Should we see a critical lab result, you will be contacted sooner.   If You Need Anything After Your Visit  If you have any questions or concerns for your doctor, please call our main line at [phone] If no one answers, please leave a voicemail as directed and we will return your call as soon as possible. Messages left after 4 pm will be answered the following business day.   You may also send [place] a message via MyChart. We typically respond to MyChart messages within 1-2 business days.  For prescription refills, please ask your pharmacy to contact our office. Our fax number is [phone].  If you have an urgent issue when the clinic is closed that cannot wait until the next business day, you can page your doctor at the number below.    Please note that while we do our best to be available for urgent issues outside of office hours, we are not available 24/7.   If you have an urgent issue and are unable to reach [place], you may choose to seek medical care at your doctor's office, retail clinic, urgent care center, or emergency room.  If you have a medical emergency, please immediately call 911 or go to the emergency department. In the event of inclement weather, please call our main line at [phone] for an update on  the status of any delays or closures.  Dermatology Medication Tips: Please keep the boxes that topical medications come in in order to help keep track of the instructions about where and how to use these. Pharmacies typically print the medication instructions only on the boxes and not directly on the medication tubes.   If your medication is too expensive, please contact our office at [phone] or send [place] a message through MyChart.   We are unable to tell what your co-pay for medications will be in advance as  this is different depending on your insurance coverage. However, we may be able to find a substitute medication at lower cost or fill out paperwork to get insurance to cover a needed medication.   If a prior authorization is required to get your medication covered by your insurance company, please allow [place] 1-2 business days to complete this process.  Drug prices often vary depending on where the prescription is filled and some pharmacies may offer cheaper prices.  The website www.goodrx.com contains coupons for medications through different pharmacies. The prices here do not account for what the cost may be with help from insurance (it may be cheaper with your insurance), but the website can give you the price if you did not use any insurance.  - You can print the associated coupon and take it with your prescription to the pharmacy.  - You may also stop by our office during regular business hours and pick up a GoodRx coupon card.  - If you need your prescription sent electronically to a different pharmacy, notify our office through [facility] or by phone at [phone]

## 2023-07-02 ENCOUNTER — Ambulatory Visit: Payer: BC Managed Care – PPO | Admitting: Family Medicine

## 2023-07-11 ENCOUNTER — Ambulatory Visit: Payer: 59 | Admitting: Dermatology

## 2023-07-18 ENCOUNTER — Ambulatory Visit (INDEPENDENT_AMBULATORY_CARE_PROVIDER_SITE_OTHER): Payer: 59 | Admitting: Family Medicine

## 2023-07-18 VITALS — BP 112/70 | HR 83 | Temp 98.5°F | Resp 18 | Ht 68.0 in | Wt 190.6 lb

## 2023-07-18 DIAGNOSIS — J3089 Other allergic rhinitis: Secondary | ICD-10-CM | POA: Diagnosis not present

## 2023-07-18 DIAGNOSIS — R051 Acute cough: Secondary | ICD-10-CM

## 2023-07-18 DIAGNOSIS — E310 Autoimmune polyglandular failure: Secondary | ICD-10-CM | POA: Diagnosis not present

## 2023-07-18 DIAGNOSIS — R0982 Postnasal drip: Secondary | ICD-10-CM | POA: Diagnosis not present

## 2023-07-18 DIAGNOSIS — E039 Hypothyroidism, unspecified: Secondary | ICD-10-CM

## 2023-07-18 DIAGNOSIS — L309 Dermatitis, unspecified: Secondary | ICD-10-CM | POA: Diagnosis not present

## 2023-07-18 DIAGNOSIS — E271 Primary adrenocortical insufficiency: Secondary | ICD-10-CM

## 2023-07-18 NOTE — Patient Instructions (Signed)
 It was nice meeting you this visit.  You can set up a physical at your earliest convenience.  Otherwise follow-up as needed.

## 2023-07-18 NOTE — Progress Notes (Signed)
Established Patient Office Visit   Subjective  Patient ID: Heidi Adkins, female    DOB: 10/11/1980  Age: 43 y.o. MRN: [id number]  Chief Complaint  Patient presents with   Establish Care    Pt is a 43 yo female seen for establish care. Previously seen by French Ana McLean-Scoccuza.  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  Patient taking Cortef and Synthroid.  Wears medical alert bracelet.  Followed by endocrinology, Dr. Cleon Gustin.  Eczema: Seen by dermatology.  Typically has flares on the neck due to increased stress and the winter.  Takes hot showers.  Hydrocortisone cream.  Seasonal allergies: Takes Zyrtec or Claritin.  Notes increased coughing at night.  Symptoms with increased activity.  Father with history of asthma.  Allergies: NKDA     Patient Active Problem List   Diagnosis Date Noted   Cervical polyp 09/07/2021   Iron deficiency 09/07/2021   Allergic rhinitis 06/20/2018   ANA positive 02/07/2018   Iron deficiency anemia 01/25/2018   Vitamin D deficiency 01/25/2018   Addison's disease (HCC) 01/18/2018   Hypothyroidism 01/18/2018   Postpartum care following repeat c-section with BTL (5/6) 11/06/2015   Status post bilateral salpingectomy 11/06/2015   Past Medical History:  Diagnosis Date   Addison's disease (HCC)    Allergy    Anemia    COVID-19    4 or 10/2020   Hx of varicella    Hypothyroidism    hypothyroidism    Postpartum care following repeat cesarean delivery with bilateral salpingectomy (5/6) 11/06/2015   Status post bilateral salpingectomy 11/06/2015   Vaginal Pap smear, abnormal    Past Surgical History:  Procedure Laterality Date   CESAREAN SECTION     CESAREAN SECTION  11/08/2011   Procedure: CESAREAN SECTION;  Surgeon: Serita Kyle, MD;  Location: [facility];  Service: Gynecology;  Laterality: N/A;   CESAREAN SECTION N/A 11/06/2015   Procedure: Repeat CESAREAN SECTION;  Surgeon: Maxie Better, MD;  Location: [facility];  Service: Obstetrics;  Laterality: N/A;  EDD: 11/12/15   OTHER SURGICAL HISTORY     fallopian tube removal b/l    TUBAL LIGATION Bilateral 11/06/2015   Procedure: BILATERAL TUBAL LIGATION;  Surgeon: Maxie Better, MD;  Location: [facility];  Service: Obstetrics;  Laterality: Bilateral;   Social History   Tobacco Use   Smoking status: Never   Smokeless tobacco: Never  Substance Use Topics   Alcohol use: No   Drug use: No   Family History  Problem Relation Age of Onset   Miscarriages / Stillbirths Mother    Fibromyalgia Mother    Diabetes Father    Urolithiasis Father    Hypertension Father    Arthritis Father    Asthma Father    Hyperlipidemia Father    Cancer Sister        cervical vs ovarian ? died age 37 pt stated ovarian    Depression Maternal Grandmother    Hypertension Maternal Grandmother    Deep vein thrombosis Maternal Grandmother    Diabetes Maternal Grandmother    Heart disease Maternal Grandmother    Hearing loss Maternal Grandmother    Cancer Maternal Grandfather        colon   Asthma Paternal Grandmother    Breast cancer Paternal Grandmother    Depression Paternal Grandmother    Diabetes Paternal Grandmother    Cancer Paternal Grandmother        breast    Diabetes Paternal Grandfather  Asthma Son    Cancer Other        p. great aunts cancer x 2    Allergies  Allergen Reactions   Latex     ROS Negative unless stated above    Objective:     BP 112/70   Pulse 83   Temp 98.5 F (36.9 C)   Resp 18   Ht 5\' 8"  (1.727 m)   Wt 190 lb 9.6 oz (86.5 kg)   SpO2 99%   BMI 28.98 kg/m  BP Readings from Last 3 Encounters:  07/18/23 112/70  04/10/23 100/66  09/07/21 100/70   Wt Readings from Last 3 Encounters:  07/18/23 190 lb 9.6 oz (86.5 kg)  09/07/21 183 lb 9.6 oz (83.3 kg)  06/20/18 176 lb 12.8 oz (80.2 kg)    Physical Exam Constitutional:      General: She is not in acute distress.    Appearance: Normal  appearance.     Comments: Wearing medical alert bracelet  HENT:     Head: Normocephalic and atraumatic.     Nose: Nose normal.     Mouth/Throat:     Mouth: Mucous membranes are moist.     Pharynx: Postnasal drip present.  Cardiovascular:     Rate and Rhythm: Normal rate and regular rhythm.     Heart sounds: Normal heart sounds. No murmur heard.    No gallop.  Pulmonary:     Effort: Pulmonary effort is normal. No respiratory distress.     Breath sounds: Normal breath sounds. No wheezing, rhonchi or rales.  Skin:    General: Skin is warm and dry.  Neurological:     Mental Status: She is alert and oriented to person, place, and time.      No results found for any visits on 07/18/23.    Assessment & Plan:  Environmental and seasonal allergies -Different allergy medication such as Allegra, Xyzal, or Flonase nasal spray -Avoid triggers when possible -Consider follow-up with allergist for continued or worsening symptoms.  Eczema, unspecified type -Discussed ways to prevent increased symptoms including taking warm not hot showers, apply a gentle moisturizer to skin immediately after getting out of shower, etc. -Continue hydrocortisone cream as needed -Continue follow-up with dermatology  Post-nasal drainage -OTC antihistamine.  Consider switching from Zyrtec/Claritin to Allegra or Xyzal.  Can also use Flonase nasal spray or saline nasal rinse to help with symptoms. -For continued or worsening symptoms consider Singulair.  Schmidt syndrome [facility]) -A combination of Addison's and hypothyroidism. -Has medical alert bracelet -Continue follow-up with endocrinology  Addison's disease (HCC) -Continue Cortef 10 mg daily -Will likely need stress dose steroids if becomes acute ill  Hypothyroidism, unspecified type -Stable -Continue Synthroid 75 mcg daily  Acute cough -Likely 2/2 postnasal drainage from increased allergy symptoms.  Also consider asthma given personal history of  eczema and allergies as well as family history of asthma. -Patient advised to switch OTC p.o. antihistamines.  Also consider Flonase or saline nasal rinse. -For continued or worsening symptoms PFTs will pulmonology.  Return if symptoms worsen or fail to improve.  Patient to set up physical at her convenience.  Deeann Saint, MD

## 2023-07-23 ENCOUNTER — Encounter: Payer: Self-pay | Admitting: Family Medicine

## 2023-10-18 ENCOUNTER — Encounter: Admitting: Family Medicine

## 2024-01-14 ENCOUNTER — Other Ambulatory Visit: Payer: Self-pay | Admitting: Family Medicine

## 2024-01-14 DIAGNOSIS — Z1231 Encounter for screening mammogram for malignant neoplasm of breast: Secondary | ICD-10-CM

## 2024-01-22 ENCOUNTER — Ambulatory Visit
Admission: RE | Admit: 2024-01-22 | Discharge: 2024-01-22 | Disposition: A | Discharge status: Home / Self Care | Source: Ambulatory Visit

## 2024-01-22 DIAGNOSIS — Z1231 Encounter for screening mammogram for malignant neoplasm of breast: Secondary | ICD-10-CM

## 2024-01-24 ENCOUNTER — Encounter: Admitting: Family Medicine

## 2024-01-31 ENCOUNTER — Ambulatory Visit (INDEPENDENT_AMBULATORY_CARE_PROVIDER_SITE_OTHER): Admitting: Family Medicine

## 2024-01-31 VITALS — BP 98/66 | HR 76 | Temp 98.6°F | Ht 68.0 in | Wt 191.8 lb

## 2024-01-31 DIAGNOSIS — Z0000 Encounter for general adult medical examination without abnormal findings: Secondary | ICD-10-CM

## 2024-01-31 DIAGNOSIS — E039 Hypothyroidism, unspecified: Secondary | ICD-10-CM

## 2024-01-31 NOTE — Progress Notes (Signed)
 Established Patient Office Visit   Subjective  Patient ID: Heidi Adkins, female    DOB: 21-Jul-1980  Age: 43 y.o. MRN: [id number]  Chief Complaint  Patient presents with   Annual Exam    Patient is a 42 year old female seen for CPE.  Patient doing well overall.  States had a Pap a few years ago then sent to Gyn due to a cervical polyp.  Endorses family history of ovarian cancer in her sister. Pt inquires about when she may expect menopause as her mother and grandmother both had it prior to age 42.  Patient mentions endocrinology also expected her to experience symptoms prior to now given her history of Schmidt syndrome.  Patient denies changes in menses or perimenopausal symptoms.    Patient Active Problem List   Diagnosis Date Noted   Cervical polyp 09/07/2021   Iron  deficiency 09/07/2021   Allergic rhinitis 06/20/2018   ANA positive 02/07/2018   Iron  deficiency anemia 01/25/2018   Vitamin D  deficiency 01/25/2018   Addison's disease (HCC) 01/18/2018   Hypothyroidism 01/18/2018   Postpartum care following repeat c-section with BTL (5/6) 11/06/2015   Status post bilateral salpingectomy 11/06/2015   Past Medical History:  Diagnosis Date   Addison's disease (HCC)    Allergy    Anemia    COVID-19    4 or 10/2020   GERD (gastroesophageal reflux disease) 2018   Hx of varicella    Hypothyroidism    hypothyroidism    Postpartum care following repeat cesarean delivery with bilateral salpingectomy (5/6) 11/06/2015   Status post bilateral salpingectomy 11/06/2015   Vaginal Pap smear, abnormal    Past Surgical History:  Procedure Laterality Date   CESAREAN SECTION     CESAREAN SECTION  11/08/2011   Procedure: CESAREAN SECTION;  Surgeon: Dickie DELENA Carder, MD;  Location: [facility];  Service: Gynecology;  Laterality: N/A;   CESAREAN SECTION N/A 11/06/2015   Procedure: Repeat CESAREAN SECTION;  Surgeon: Dickie Carder, MD;  Location: [facility];  Service: Obstetrics;   Laterality: N/A;  EDD: 11/12/15   OTHER SURGICAL HISTORY     fallopian tube removal b/l    TUBAL LIGATION Bilateral 11/06/2015   Procedure: BILATERAL TUBAL LIGATION;  Surgeon: Dickie Carder, MD;  Location: [facility];  Service: Obstetrics;  Laterality: Bilateral;   Social History   Tobacco Use   Smoking status: Never   Smokeless tobacco: Never  Substance Use Topics   Alcohol use: No   Drug use: No   Family History  Problem Relation Age of Onset   Miscarriages / Stillbirths Mother    Fibromyalgia Mother    Varicose Veins Mother    Diabetes Father    Urolithiasis Father    Hypertension Father    Arthritis Father    Asthma Father    Hyperlipidemia Father    Cancer Sister        cervical vs ovarian ? died age 59 pt stated ovarian    Depression Maternal Grandmother    Hypertension Maternal Grandmother    Deep vein thrombosis Maternal Grandmother    Diabetes Maternal Grandmother    Heart disease Maternal Grandmother    Hearing loss Maternal Grandmother    Cancer Maternal Grandfather        colon   Asthma Paternal Grandmother    Breast cancer Paternal Grandmother 56   Depression Paternal Grandmother    Diabetes Paternal Grandmother    Cancer Paternal Grandmother        breast  Diabetes Paternal Grandfather    Asthma Son    Cancer Other        p. great aunts cancer x 2    Allergies  Allergen Reactions   Latex     ROS Negative unless stated above    Objective:     BP 98/66 (BP Location: Left Arm, Patient Position: Sitting, Cuff Size: Normal)   Pulse 76   Temp 98.6 F (37 C) (Oral)   Ht 5' 8 (1.727 m)   Wt 191 lb 12.8 oz (87 kg)   LMP 01/21/2024 (Approximate)   SpO2 99%   Breastfeeding No   BMI 29.16 kg/m  BP Readings from Last 3 Encounters:  01/31/24 98/66  07/18/23 112/70  04/10/23 100/66   Wt Readings from Last 3 Encounters:  01/31/24 191 lb 12.8 oz (87 kg)  07/18/23 190 lb 9.6 oz (86.5 kg)  09/07/21 183 lb 9.6 oz (83.3 kg)       Physical Exam Constitutional:      Appearance: Normal appearance.  HENT:     Head: Normocephalic and atraumatic.     Right Ear: Tympanic membrane, ear canal and external ear normal.     Left Ear: Tympanic membrane, ear canal and external ear normal.     Nose: Nose normal.     Mouth/Throat:     Mouth: Mucous membranes are moist.     Pharynx: No oropharyngeal exudate or posterior oropharyngeal erythema.  Eyes:     General: No scleral icterus.    Extraocular Movements: Extraocular movements intact.     Conjunctiva/sclera: Conjunctivae normal.     Pupils: Pupils are equal, round, and reactive to light.  Neck:     Thyroid: No thyromegaly.  Cardiovascular:     Rate and Rhythm: Normal rate and regular rhythm.     Pulses: Normal pulses.     Heart sounds: Normal heart sounds. No murmur heard.    No friction rub.  Pulmonary:     Effort: Pulmonary effort is normal.     Breath sounds: Normal breath sounds. No wheezing, rhonchi or rales.  Abdominal:     General: Bowel sounds are normal.     Palpations: Abdomen is soft.     Tenderness: There is no abdominal tenderness.  Musculoskeletal:        General: No deformity. Normal range of motion.  Lymphadenopathy:     Cervical: No cervical adenopathy.  Skin:    General: Skin is warm and dry.     Findings: No lesion.  Neurological:     General: No focal deficit present.     Mental Status: She is alert and oriented to person, place, and time.  Psychiatric:        Mood and Affect: Mood normal.        Thought Content: Thought content normal.        01/31/2024    1:40 PM 07/18/2023    2:01 PM 09/07/2021   10:18 AM  Depression screen PHQ 2/9  Decreased Interest 0 0 0  Down, Depressed, Hopeless 0 0 0  PHQ - 2 Score 0 0 0  Altered sleeping 0 0   Tired, decreased energy 0 1   Change in appetite 0 0   Feeling bad or failure about yourself  0 0   Trouble concentrating 0 0   Moving slowly or fidgety/restless 0 0   Suicidal thoughts 0 0    PHQ-9 Score 0 1   Difficult doing work/chores  Not difficult at  all       01/31/2024    1:41 PM 07/18/2023    2:01 PM  GAD 7 : Generalized Anxiety Score  Nervous, Anxious, on Edge 0 0  Control/stop worrying 0 0  Worry too much - different things 0 0  Trouble relaxing 0 0  Restless 0 0  Easily annoyed or irritable 0 0  Afraid - awful might happen 0 0  Total GAD 7 Score 0 0  Anxiety Difficulty Not difficult at all Not difficult at all     No results found for any visits on 01/31/24.    Assessment & Plan:   Well adult exam -     CBC with Differential/Platelet; Future -     Comprehensive metabolic panel with GFR; Future -     Hemoglobin A1c; Future -     Lipid panel; Future  Hypothyroidism, unspecified type -     TSH; Future   Age-appropriate health screenings discussed.  Obtain labs when patient is fasting.  Orders placed.  Pap up-to-date done 09/08/2021.  Immunizations removed.  Tdap up-to-date.  Mammogram done 01/27/2024.  Colonoscopy not yet indicated.  Continue follow-up with endocrinology for history of Addison's disease/hypothyroidism.  Continue Synthroid  75 mcg daily.  No follow-ups on file.   Clotilda JONELLE Single, MD

## 2024-01-31 NOTE — Patient Instructions (Signed)
 It looks like you Pap was done 09/07/2021.

## 2024-02-01 ENCOUNTER — Other Ambulatory Visit (INDEPENDENT_AMBULATORY_CARE_PROVIDER_SITE_OTHER)

## 2024-02-01 DIAGNOSIS — Z0000 Encounter for general adult medical examination without abnormal findings: Secondary | ICD-10-CM | POA: Diagnosis not present

## 2024-02-01 DIAGNOSIS — E039 Hypothyroidism, unspecified: Secondary | ICD-10-CM

## 2024-02-01 LAB — COMPREHENSIVE METABOLIC PANEL WITH GFR
ALT: 8 U/L (ref 0–35)
AST: 15 U/L (ref 0–37)
Albumin: 3.9 g/dL (ref 3.5–5.2)
Alkaline Phosphatase: 38 U/L — ABNORMAL LOW (ref 39–117)
BUN: 6 mg/dL (ref 6–23)
CO2: 28 meq/L (ref 19–32)
Calcium: 8.5 mg/dL (ref 8.4–10.5)
Chloride: 104 meq/L (ref 96–112)
Creatinine, Ser: 0.62 mg/dL (ref 0.40–1.20)
GFR: 108.96 mL/min (ref >60.00)
Glucose, Bld: 79 mg/dL (ref 70–99)
Potassium: 3.5 meq/L (ref 3.5–5.1)
Sodium: 138 meq/L (ref 135–145)
Total Bilirubin: 0.5 mg/dL (ref 0.2–1.2)
Total Protein: 7.2 g/dL (ref 6.0–8.3)

## 2024-02-01 LAB — CBC WITH DIFFERENTIAL/PLATELET
Basophils Absolute: 0.1 K/uL (ref 0.0–0.1)
Basophils Relative: 1.2 % (ref 0.0–3.0)
Eosinophils Absolute: 0.7 K/uL (ref 0.0–0.7)
Eosinophils Relative: 15.3 % — ABNORMAL HIGH (ref 0.0–5.0)
HCT: 30.5 % — ABNORMAL LOW (ref 36.0–46.0)
Hemoglobin: 9.2 g/dL — ABNORMAL LOW (ref 12.0–15.0)
Lymphocytes Relative: 50.1 % — ABNORMAL HIGH (ref 12.0–46.0)
Lymphs Abs: 2.2 K/uL (ref 0.7–4.0)
MCHC: 30.2 g/dL (ref 30.0–36.0)
MCV: 64.9 fl — ABNORMAL LOW (ref 78.0–100.0)
Monocytes Absolute: 0.4 K/uL (ref 0.1–1.0)
Monocytes Relative: 10.2 % (ref 3.0–12.0)
Neutro Abs: 1 K/uL — ABNORMAL LOW (ref 1.4–7.7)
Neutrophils Relative %: 23.2 % — ABNORMAL LOW (ref 43.0–77.0)
Platelets: 223 K/uL (ref 150.0–400.0)
RBC: 4.69 Mil/uL (ref 3.87–5.11)
RDW: 18.8 % — ABNORMAL HIGH (ref 11.5–15.5)
WBC: 4.4 K/uL (ref 4.0–10.5)

## 2024-02-01 LAB — LIPID PANEL
Cholesterol: 143 mg/dL (ref 0–200)
HDL: 53.3 mg/dL (ref >39.00)
LDL Cholesterol: 76 mg/dL (ref 0–99)
NonHDL: 89.44
Total CHOL/HDL Ratio: 3
Triglycerides: 67 mg/dL (ref 0.0–149.0)
VLDL: 13.4 mg/dL (ref 0.0–40.0)

## 2024-02-01 LAB — HEMOGLOBIN A1C: Hgb A1c MFr Bld: 5.8 % (ref 4.6–6.5)

## 2024-02-01 LAB — TSH: TSH: 2.21 u[IU]/mL (ref 0.35–5.50)

## 2024-02-08 ENCOUNTER — Ambulatory Visit: Payer: Self-pay | Admitting: Family Medicine

## 2024-02-08 DIAGNOSIS — D509 Iron deficiency anemia, unspecified: Secondary | ICD-10-CM

## 2024-02-08 MED ORDER — FERROUS GLUCONATE 324 (37.5 FE) MG PO TABS: 1.0000 | ORAL_TABLET | Freq: Every day | ORAL | 1 refills | Status: AC
